# Patient Record
Sex: Male | Born: 2000 | Race: White | Hispanic: No | Marital: Single | Smoking: Never smoker
Health system: Southern US, Community
[De-identification: ages and names within clinical notes are randomized; demographics above are authoritative.]

## PROBLEM LIST (undated history)

## (undated) DIAGNOSIS — J45909 Unspecified asthma, uncomplicated: Secondary | ICD-10-CM

## (undated) HISTORY — PX: WISDOM TOOTH EXTRACTION: SHX21

## (undated) HISTORY — PX: MYRINGOTOMY WITH TUBE PLACEMENT: SHX5663

---
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
## 2020-11-02 ENCOUNTER — Encounter: Payer: Self-pay | Admitting: Nurse Practitioner

## 2020-11-04 ENCOUNTER — Ambulatory Visit: Payer: 59 | Admitting: Gastroenterology

## 2020-11-04 ENCOUNTER — Other Ambulatory Visit: Payer: 59

## 2020-11-04 ENCOUNTER — Encounter: Payer: Self-pay | Admitting: Gastroenterology

## 2020-11-04 ENCOUNTER — Other Ambulatory Visit (INDEPENDENT_AMBULATORY_CARE_PROVIDER_SITE_OTHER): Payer: 59

## 2020-11-04 VITALS — BP 96/68 | HR 76 | Ht 71.0 in | Wt 142.0 lb

## 2020-11-04 DIAGNOSIS — R634 Abnormal weight loss: Secondary | ICD-10-CM

## 2020-11-04 DIAGNOSIS — K529 Noninfective gastroenteritis and colitis, unspecified: Secondary | ICD-10-CM | POA: Diagnosis not present

## 2020-11-04 LAB — CBC WITH DIFFERENTIAL/PLATELET
Basophils Absolute: 0 10*3/uL (ref 0.0–0.1)
Basophils Relative: 0.4 % (ref 0.0–3.0)
Eosinophils Absolute: 0.6 10*3/uL (ref 0.0–0.7)
Eosinophils Relative: 9.3 % — ABNORMAL HIGH (ref 0.0–5.0)
HCT: 44.8 % (ref 39.0–52.0)
Hemoglobin: 15 g/dL (ref 13.0–17.0)
Lymphocytes Relative: 38.8 % (ref 12.0–46.0)
Lymphs Abs: 2.3 10*3/uL (ref 0.7–4.0)
MCHC: 33.4 g/dL (ref 30.0–36.0)
MCV: 89.5 fl (ref 78.0–100.0)
Monocytes Absolute: 0.5 10*3/uL (ref 0.1–1.0)
Monocytes Relative: 9.1 % (ref 3.0–12.0)
Neutro Abs: 2.5 10*3/uL (ref 1.4–7.7)
Neutrophils Relative %: 42.4 % — ABNORMAL LOW (ref 43.0–77.0)
Platelets: 233 10*3/uL (ref 150.0–400.0)
RBC: 5.01 Mil/uL (ref 4.22–5.81)
RDW: 12.3 % (ref 11.5–14.6)
WBC: 6 10*3/uL (ref 4.5–10.5)

## 2020-11-04 LAB — COMPREHENSIVE METABOLIC PANEL
ALT: 11 U/L (ref 0–53)
AST: 13 U/L (ref 0–37)
Albumin: 4.7 g/dL (ref 3.5–5.2)
Alkaline Phosphatase: 63 U/L (ref 39–117)
BUN: 16 mg/dL (ref 6–23)
CO2: 29 mEq/L (ref 19–32)
Calcium: 9.7 mg/dL (ref 8.4–10.5)
Chloride: 104 mEq/L (ref 96–112)
Creatinine, Ser: 0.81 mg/dL (ref 0.40–1.50)
GFR: 127.1 mL/min (ref >60.00)
Glucose, Bld: 94 mg/dL (ref 70–99)
Potassium: 4 mEq/L (ref 3.5–5.1)
Sodium: 139 mEq/L (ref 135–145)
Total Bilirubin: 1.8 mg/dL — ABNORMAL HIGH (ref 0.2–1.2)
Total Protein: 6.9 g/dL (ref 6.0–8.3)

## 2020-11-04 LAB — SEDIMENTATION RATE: Sed Rate: 1 mm/hr (ref 0–15)

## 2020-11-04 NOTE — Patient Instructions (Signed)
Your provider has requested that you go to the basement level for lab work before leaving today. Press "B" on the elevator. The lab is located at the first door on the left as you exit the elevator.   Due to recent changes in healthcare laws, you may see the results of your imaging and laboratory studies on MyChart before your provider has had a chance to review them.  We understand that in some cases there may be results that are confusing or concerning to you. Not all laboratory results come back in the same time frame and the provider may be waiting for multiple results in order to interpret others.  Please give [place] 48 hours in order for your provider to thoroughly review all the results before contacting the office for clarification of your results.    If you are age 64 or older, your body mass index should be between 23-30. Your Body mass index is 19.8 kg/m. If this is out of the aforementioned range listed, please consider follow up with your Primary Care Provider.  If you are age 44 or younger, your body mass index should be between 19-25. Your Body mass index is 19.8 kg/m. If this is out of the aformentioned range listed, please consider follow up with your Primary Care Provider.   __________________________________________________________  The [facility] providers would like to encourage you to use [facility] to communicate with providers for non-urgent requests or questions.  Due to long hold times on the telephone, sending your provider a message by [facility] may be a faster and more efficient way to get a response.  Please allow 48 business hours for a response.  Please remember that this is for non-urgent requests.    I appreciate the  opportunity to care for you  Thank You   Marsa Aris , MD

## 2020-11-04 NOTE — Progress Notes (Signed)
Kenneth Cardenas    [id number]    2000/08/25  Primary Care Physician:Nche, Charlene Brooke, NP  Referring Physician: Flossie Buffy, NP [address] [place],  [place] [address]   Chief complaint: Diarrhea, weight loss  HPI: 20 year old very pleasant male here with complaints of diarrhea since he had COVID infection in July 2022.  Since then he has been having multiple bowel movements per day, on average 3-4 semiformed to liquid bowel movements.  His brother had C. difficile infection in August 2022, he was sharing a bathroom with him at the time.  He has not done any stool testing to exclude C. difficile infection.  No other sick contacts.  No recent antibiotic use. Denies any rectal bleeding, mucus or blood in stool. He has lost about 10 pounds in the past 3 months.  He feels his appetite is back to normal and he is eating well but is unable to gain weight. No family history of celiac disease IBD or GI malignancy.   Outpatient Encounter Medications as of 11/04/2020  Medication Sig   loratadine (CLARITIN) 10 MG tablet Take 10 mg by mouth daily as needed.   No facility-administered encounter medications on file as of 11/04/2020.    Allergies as of 11/04/2020 - Review Complete 11/04/2020  Allergen Reaction Noted   Dog epithelium Itching 01/10/2014   Dog epithelium allergy skin test Itching 01/10/2014   Dust mite extract Other (See Comments) 01/10/2014    Past Medical History:  Diagnosis Date   Asthma     Past Surgical History:  Procedure Laterality Date   MYRINGOTOMY WITH TUBE PLACEMENT     WISDOM TOOTH EXTRACTION      Family History  Problem Relation Age of Onset   Hypertension Mother    Hypertension Father    Colon cancer Neg Hx     Social History   Socioeconomic History   Marital status: Single    Spouse name: Not on file   Number of children: 0   Years of education: Not on file   Highest education level: High school graduate   Occupational History    Comment: Ship broker at [employer]  Tobacco Use   Smoking status: Never   Smokeless tobacco: Never  Vaping Use   Vaping Use: Former  Substance and Sexual Activity   Alcohol use: Not Currently   Drug use: Yes    Types: Marijuana    Comment: marijuana use in past   Sexual activity: Not Currently    Birth control/protection: None  Other Topics Concern   Not on file  Social History Narrative   Student at [employer], virtual classes at this time   Home with parents   Denies any struggles with classes at this time.   Plays video games with friend, limited in person interaction due to [place] pandemic.   Social Determinants of Health   Financial Resource Strain: Not on file  Food Insecurity: Not on file  Transportation Needs: Not on file  Physical Activity: Not on file  Stress: Not on file  Social Connections: Not on file  Intimate Partner Violence: Not on file      Review of systems: All other review of systems negative except as mentioned in the HPI.   Physical Exam: Vitals:   11/04/20 1030  BP: 96/68  Pulse: 76  SpO2: 99%   Body mass index is 19.8 kg/m. Gen:      No acute distress HEENT:  sclera  anicteric Abd:      soft, non-tender; no palpable masses, no distension Ext:    No edema Neuro: alert and oriented x 3 Psych: normal mood and affect  Data Reviewed:  Reviewed labs, radiology imaging, old records and pertinent past GI work up   Assessment and Plan/Recommendations:  20 year old male with complaints of chronic diarrhea, weight loss and abdominal cramping X3 month after COVID infection in July 2022 He had exposure to C. difficile infection  We will check stool GI pathogen panel to exclude acute GI infections including C. Difficile Check TTG IgA antibody to exclude celiac disease Follow-up CBC, CRP/ESR and CMP  If above work-up negative unrevealing for etiology of diarrhea, will consider colonoscopy with biopsies for further  evaluation  Return in 4 to 6 weeks  The patient was provided an opportunity to ask questions and all were answered. The patient agreed with the plan and demonstrated an understanding of the instructions.  Damaris Hippo , MD    CC: Nche, Charlene Brooke, NP

## 2020-11-05 LAB — TISSUE TRANSGLUTAMINASE, IGA: (tTG) Ab, IgA: <1 U/mL

## 2020-11-05 LAB — IGA: Immunoglobulin A: 153 mg/dL (ref 47–310)

## 2020-11-08 ENCOUNTER — Telehealth: Payer: Self-pay | Admitting: Gastroenterology

## 2020-11-08 LAB — GI PROFILE, STOOL, PCR

## 2020-11-08 NOTE — Telephone Encounter (Signed)
Patient returned your call for lab results.  Please call back.  Thank you.

## 2020-11-08 NOTE — Telephone Encounter (Signed)
Inbound call from [facility] calling with results for this pt. Please advise. Thank you.

## 2020-11-08 NOTE — Telephone Encounter (Signed)
Returned patient's call and gave him stool test results

## 2020-11-08 NOTE — Telephone Encounter (Signed)
[facility] called and wanted to alert [place] to the abnormal stool and blood labs for this patient. Please see. Thanks

## 2020-11-08 NOTE — Telephone Encounter (Signed)
Please see results note. Thanks.

## 2021-01-03 ENCOUNTER — Ambulatory Visit: Payer: 59 | Admitting: Gastroenterology

## 2021-07-25 ENCOUNTER — Encounter: Payer: Self-pay | Admitting: Nurse Practitioner

## 2021-07-26 ENCOUNTER — Encounter: Payer: Self-pay | Admitting: Nurse Practitioner

## 2021-07-26 ENCOUNTER — Ambulatory Visit: Payer: 59 | Admitting: Nurse Practitioner

## 2021-07-26 VITALS — BP 122/82 | HR 104 | Temp 97.0°F | Ht 71.0 in | Wt 149.8 lb

## 2021-07-26 DIAGNOSIS — R052 Subacute cough: Secondary | ICD-10-CM

## 2021-10-27 ENCOUNTER — Encounter: Payer: Self-pay | Admitting: Nurse Practitioner

## 2021-10-27 ENCOUNTER — Ambulatory Visit (INDEPENDENT_AMBULATORY_CARE_PROVIDER_SITE_OTHER): Payer: 59 | Admitting: Nurse Practitioner

## 2021-10-27 VITALS — BP 130/80 | HR 107 | Temp 97.7°F | Ht 71.0 in | Wt 169.4 lb

## 2021-10-27 DIAGNOSIS — M940 Chondrocostal junction syndrome [Tietze]: Secondary | ICD-10-CM | POA: Diagnosis not present

## 2021-10-27 DIAGNOSIS — R4589 Other symptoms and signs involving emotional state: Secondary | ICD-10-CM | POA: Insufficient documentation

## 2021-10-27 DIAGNOSIS — F418 Other specified anxiety disorders: Secondary | ICD-10-CM

## 2021-10-27 DIAGNOSIS — Z23 Encounter for immunization: Secondary | ICD-10-CM

## 2021-10-27 DIAGNOSIS — Z0001 Encounter for general adult medical examination with abnormal findings: Secondary | ICD-10-CM | POA: Diagnosis not present

## 2021-10-27 DIAGNOSIS — Q676 Pectus excavatum: Secondary | ICD-10-CM | POA: Insufficient documentation

## 2021-10-27 HISTORY — DX: Chondrocostal junction syndrome (tietze): M94.0

## 2021-10-27 LAB — COMPREHENSIVE METABOLIC PANEL
ALT: 30 U/L (ref 0–53)
AST: 21 U/L (ref 0–37)
Albumin: 4.8 g/dL (ref 3.5–5.2)
Alkaline Phosphatase: 79 U/L (ref 39–117)
BUN: 19 mg/dL (ref 6–23)
CO2: 28 mEq/L (ref 19–32)
Calcium: 9.7 mg/dL (ref 8.4–10.5)
Chloride: 101 mEq/L (ref 96–112)
Creatinine, Ser: 0.76 mg/dL (ref 0.40–1.50)
GFR: 128.68 mL/min (ref >60.00)
Glucose, Bld: 98 mg/dL (ref 70–99)
Potassium: 4 mEq/L (ref 3.5–5.1)
Sodium: 137 mEq/L (ref 135–145)
Total Bilirubin: 1.5 mg/dL — ABNORMAL HIGH (ref 0.2–1.2)
Total Protein: 7.3 g/dL (ref 6.0–8.3)

## 2021-10-27 LAB — CBC
HCT: 45.5 % (ref 39.0–52.0)
Hemoglobin: 15.4 g/dL (ref 13.0–17.0)
MCHC: 33.7 g/dL (ref 30.0–36.0)
MCV: 90.8 fl (ref 78.0–100.0)
Platelets: 245 10*3/uL (ref 150.0–400.0)
RBC: 5.02 Mil/uL (ref 4.22–5.81)
RDW: 12.3 % (ref 11.5–15.5)
WBC: 6.2 10*3/uL (ref 4.0–10.5)

## 2021-10-27 LAB — TSH: TSH: 1.69 u[IU]/mL (ref 0.35–5.50)

## 2021-10-27 NOTE — Progress Notes (Signed)
Complete physical exam  Patient: Kenneth Cardenas   DOB: 01-11-2001   21 y.o. Male  MRN: [id number] Visit Date: 10/27/2021  Subjective:    Chief Complaint  Patient presents with   Annual Exam    CPE Pt fasting  C/o chest discomfort x 3 months off & on  No other concerns   Flu shot given today    Kenneth Cardenas is a 21 y.o. male who presents today for a complete physical exam. He reports consuming a general diet.  Basketball and elliptical 3x/week.  He generally feels fairly well. He reports sleeping fairly well. He does have additional problems to discuss today.  Vision:No Dental:No STD Screen:No  Most recent fall risk assessment:    10/27/2021   10:43 AM  Fall Risk   Falls in the past year? 0  Number falls in past yr: 0  Injury with Fall? 0   Most recent depression screenings:    10/27/2021   11:32 AM 07/26/2021    2:34 PM  PHQ 2/9 Scores  PHQ - 2 Score 0 0  PHQ- 9 Score 0       10/27/2021   11:32 AM 07/26/2021    2:34 PM  GAD 7 : Generalized Anxiety Score  Nervous, Anxious, on Edge 2 2  Control/stop worrying 2 0  Worry too much - different things 0 0  Trouble relaxing 2 2  Restless 0 0  Easily annoyed or irritable 0 0  Afraid - awful might happen 1 2  Total GAD 7 Score 7 6  Anxiety Difficulty Somewhat difficult Somewhat difficult    HPI  Anxiety about health Worries about his health, reports an incident of panic attack while driving: experienced chest tightness which lasted for 28mns. Resolved when he got to his destination. Denied need for psychology referral  Costochondritis, acute Chest pain arching, worse with movement from supine to sitting position and during weight training (lifts 40-60lbs), resolves with rest. No SOB, no diaphoresis, no night sweats. No arm weakness or paresthesia.  Use NSAIDs and alternate between warm and cold compress. Avoid weight lifting till pain resolves.  Past Medical History:  Diagnosis Date   Asthma    Past  Surgical History:  Procedure Laterality Date   MYRINGOTOMY WITH TUBE PLACEMENT     WISDOM TOOTH EXTRACTION     Social History   Socioeconomic History   Marital status: Single    Spouse name: Not on file   Number of children: 0   Years of education: Not on file   Highest education level: High school graduate  Occupational History    Comment: Student at [employer] Tobacco Use   Smoking status: Never   Smokeless tobacco: Never  Vaping Use   Vaping Use: Former  Substance and Sexual Activity   Alcohol use: Not Currently   Drug use: Yes    Types: Marijuana    Comment: marijuana use in past   Sexual activity: Not Currently    Birth control/protection: None  Other Topics Concern   Not on file  Social History Narrative   Student at [employer] virtual classes at this time   Home with parents   Denies any struggles with classes at this time.   Plays video games with friend, limited in person interaction due to [place].   Social Determinants of Health   Financial Resource Strain: Not on file  Food Insecurity: Not on file  Transportation Needs: Not on file  Physical Activity: Not on file  Stress: Not on file  Social Connections: Not on file  Intimate Partner Violence: Not on file   Family Status  Relation Name Status   Mother  Alive   Father  Alive   Neg Hx  (Not Specified)   Family History  Problem Relation Age of Onset   Hypertension Mother    Hypertension Father    Colon cancer Neg Hx    Allergies  Allergen Reactions   Dog Epithelium Itching   Dog Epithelium Allergy Skin Test Itching   Dust Mite Extract Other (See Comments)    Sneezing  Sneezing      Patient Care Team: Gianni Mihalik, Charlene Brooke, NP as PCP - General (Internal Medicine)   Medications: Outpatient Medications Prior to Visit  Medication Sig   loratadine (CLARITIN) 10 MG tablet Take 10 mg by mouth daily as needed. (Patient not taking: Reported on 07/26/2021)   No facility-administered medications prior  to visit.    Review of Systems  Constitutional:  Negative for fever.  HENT:  Negative for congestion and sore throat.   Eyes:        Negative for visual changes  Respiratory:  Negative for cough, chest tightness and shortness of breath.   Cardiovascular:  Positive for chest pain. Negative for palpitations and leg swelling.  Gastrointestinal:  Negative for blood in stool, constipation and diarrhea.  Genitourinary:  Negative for dysuria, frequency and urgency.  Musculoskeletal:  Negative for myalgias.  Skin:  Negative for rash.  Neurological:  Negative for dizziness and headaches.  Hematological:  Does not bruise/bleed easily.  Psychiatric/Behavioral:  Negative for self-injury, sleep disturbance and suicidal ideas. The patient is nervous/anxious.         Objective:  BP 130/80 (BP Location: Right Arm, Patient Position: Sitting, Cuff Size: Small)   Pulse (!) 107   Temp 97.7 F (36.5 C) (Temporal)   Ht '5\' 11"'  (1.803 m)   Wt 169 lb 6.4 oz (76.8 kg)   SpO2 97%   BMI 23.63 kg/m       Physical Exam Vitals and nursing note reviewed.  Constitutional:      General: He is not in acute distress. HENT:     Right Ear: Tympanic membrane, ear canal and external ear normal.     Left Ear: Tympanic membrane, ear canal and external ear normal.     Nose: Nose normal.  Eyes:     General: No scleral icterus.    Extraocular Movements: Extraocular movements intact.     Conjunctiva/sclera: Conjunctivae normal.     Pupils: Pupils are equal, round, and reactive to light.  Neck:     Thyroid: No thyromegaly.  Cardiovascular:     Rate and Rhythm: Normal rate and regular rhythm.     Pulses: Normal pulses.     Heart sounds: Normal heart sounds.  Pulmonary:     Effort: Pulmonary effort is normal.     Breath sounds: Normal breath sounds.  Chest:     Chest wall: Tenderness present.  Abdominal:     General: Bowel sounds are normal. There is no distension.     Tenderness: There is no abdominal  tenderness.  Genitourinary:    Penis: No discharge.   Musculoskeletal:        General: No tenderness. Normal range of motion.     Cervical back: Normal range of motion and neck supple.     Right lower leg: No edema.     Left lower leg: No edema.  Lymphadenopathy:  Cervical: No cervical adenopathy.  Skin:    General: Skin is warm and dry.  Neurological:     Mental Status: He is alert and oriented to person, place, and time.     Cranial Nerves: No cranial nerve deficit.     Deep Tendon Reflexes: Reflexes are normal and symmetric.  Psychiatric:        Mood and Affect: Mood normal.        Behavior: Behavior normal.        Thought Content: Thought content normal.        Judgment: Judgment normal.      No results found for any visits on 10/27/21.    Assessment & Plan:    Routine Health Maintenance and Physical Exam  Immunization History  Administered Date(s) Administered   DTaP 09/26/2000, 11/19/2000, 01/15/2001, 01/13/2002, 07/24/2005   HIB (PRP-T) 09/26/2000, 11/19/2000, 01/15/2001, 01/13/2002   HPV 9-valent 03/29/2016, 07/10/2017   Hepatitis A 01/22/2007, 08/28/2007   Hepatitis A, Ped/Adol-2 Dose 01/22/2007, 08/28/2007   Hepatitis B 05-13-00, 08/14/2000, 04/11/2001   Hepatitis B, PED/ADOLESCENT 09-Mar-2000, 08/14/2000, 04/11/2001   IPV 09/26/2000, 11/19/2000, 09/13/2001, 07/24/2005   Influenza, High Dose Seasonal PF 09/09/2014   Influenza,inj,Quad PF,6+ Mos 01/13/2002, 12/09/2018, 10/27/2021   Influenza-Unspecified 11/17/2015, 10/24/2016   MMR 09/13/2001, 07/24/2005   Meningococcal Conjugate 07/20/2011, 07/10/2017   Pneumococcal Conjugate-13 09/26/2000, 11/19/2000, 09/16/2002   Tdap 07/20/2011   Varicella 09/13/2001, 07/24/2005    Health Maintenance  Topic Date Due   Hepatitis C Screening  Never done   COVID-19 Vaccine (1) 11/12/2021 (Originally 01/11/2001)   HPV VACCINES (3 - Male 3-dose series) 12/30/2021 (Originally 11/09/2017)   TETANUS/TDAP  12/30/2021  (Originally 07/19/2021)   INFLUENZA VACCINE  Completed   HIV Screening  Completed    Discussed health benefits of physical activity, and encouraged him to engage in regular exercise appropriate for his age and condition.  Problem List Items Addressed This Visit       Musculoskeletal and Integument   Costochondritis, acute    Chest pain arching, worse with movement from supine to sitting position and during weight training (lifts 40-60lbs), resolves with rest. No SOB, no diaphoresis, no night sweats. No arm weakness or paresthesia.  Use NSAIDs and alternate between warm and cold compress. Avoid weight lifting till pain resolves.        Other   Anxiety about health    Worries about his health, reports an incident of panic attack while driving: experienced chest tightness which lasted for 58mns. Resolved when he got to his destination. Denied need for psychology referral      Other Visit Diagnoses     Encounter for preventative adult health care exam with abnormal findings    -  Primary   Relevant Orders   CBC   Comprehensive metabolic panel   TSH   Need for immunization against influenza       Relevant Orders   Flu Vaccine QUAD 6+ mos PF IM (Fluarix Quad PF) (Completed)      Return in about 1 year (around 10/28/2022) for CPE (fasting).     CWilfred Lacy NP

## 2021-10-27 NOTE — Assessment & Plan Note (Signed)
Worries about his health, reports an incident of panic attack while driving: experienced chest tightness which lasted for 19mins. Resolved when he got to his destination. Denied need for psychology referral

## 2021-10-27 NOTE — Patient Instructions (Signed)
Use ibuprofen or naproxen for chest wall pain Start daily chest wall stretch exercise Let me know if you change you mind about referral to psyhology. Decrease alcohol consumption Ok to resume daily exercise (stretch before and after exercise) Go to lab  Costochondritis  Costochondritis is irritation and swelling (inflammation) of the tissue that connects the ribs to the breastbone (sternum). This tissue is called cartilage. Costochondritis causes pain in the front of the chest. Usually, the pain: Starts slowly. Is in more than one rib. What are the causes? The exact cause of this condition is not always known. It results from stress on the tissue in the affected area. The cause of this stress could be: Chest injury. Exercise or activity, such as lifting. Very bad coughing. What increases the risk? You are more likely to develop this condition if you: Are male. Are 19-60 years old. Recently started a new exercise or work activity. Have low levels of vitamin D. Have a condition that makes you cough often. What are the signs or symptoms? The main symptom of this condition is chest pain. The pain: Usually starts slowly and can be sharp or dull. Gets worse with deep breathing, coughing, or exercise. Gets better with rest. May be worse when you press on the affected area of your ribs and breastbone. How is this treated? This condition usually goes away on its own over time. Your doctor may prescribe an NSAID, such as ibuprofen. This can help reduce pain and inflammation. Treatment may also include: Resting and avoiding activities that make pain worse. Putting heat or ice on the painful area. Doing exercises to stretch your chest muscles. If these treatments do not help, your doctor may inject a numbing medicine to help relieve the pain. Follow these instructions at home: Managing pain, stiffness, and swelling     If told, put ice on the painful area. To do this: Put ice in a  plastic bag. Place a towel between your skin and the bag. Leave the ice on for 20 minutes, 2-3 times a day. If told, put heat on the affected area. Do this as often as told by your doctor. Use the heat source that your doctor recommends, such as a moist heat pack or a heating pad. Place a towel between your skin and the heat source. Leave the heat on for 20-30 minutes. Take off the heat if your skin turns bright red. This is very important if you cannot feel pain, heat, or cold. You may have a greater risk of getting burned. Activity Rest as told by your doctor. Do not do anything that makes your pain worse. This includes any activities that use chest, belly (abdomen), and side muscles. Do not lift anything that is heavier than 10 lb (4.5 kg), or the limit that you are told, until your doctor says that it is safe. Return to your normal activities as told by your doctor. Ask your doctor what activities are safe for you. General instructions Take over-the-counter and prescription medicines only as told by your doctor. Keep all follow-up visits as told by your doctor. This is important. Contact a doctor if: You have chills or a fever. Your pain does not go away or it gets worse. You have a cough that does not go away. Get help right away if: You are short of breath. You have very bad chest pain that is not helped by medicines, heat, or ice. These symptoms may be an emergency. Do not wait to see if  the symptoms will go away. Get medical help right away. Call your local emergency services (911 in the [place] Do not drive yourself to the hospital. Summary Costochondritis is irritation and swelling (inflammation) of the tissue that connects the ribs to the breastbone (sternum). This condition causes pain in the front of the chest. Treatment may include medicines, rest, heat or ice, and exercises. This information is not intended to replace advice given to you by your health care provider. Make  sure you discuss any questions you have with your health care provider. Document Revised: 04/05/2021 Document Reviewed: 11/29/2018 Elsevier Patient Education  [place].

## 2021-10-27 NOTE — Assessment & Plan Note (Signed)
Chest pain arching, worse with movement from supine to sitting position and during weight training (lifts 40-60lbs), resolves with rest. No SOB, no diaphoresis, no night sweats. No arm weakness or paresthesia.  Use NSAIDs and alternate between warm and cold compress. Avoid weight lifting till pain resolves.

## 2021-11-22 ENCOUNTER — Ambulatory Visit
Admission: EM | Admit: 2021-11-22 | Discharge: 2021-11-22 | Disposition: A | Discharge status: Home / Self Care | Payer: 59

## 2021-11-22 ENCOUNTER — Ambulatory Visit (INDEPENDENT_AMBULATORY_CARE_PROVIDER_SITE_OTHER): Payer: 59

## 2021-11-22 DIAGNOSIS — R059 Cough, unspecified: Secondary | ICD-10-CM | POA: Diagnosis not present

## 2021-11-22 DIAGNOSIS — B9789 Other viral agents as the cause of diseases classified elsewhere: Secondary | ICD-10-CM

## 2021-11-22 DIAGNOSIS — J988 Other specified respiratory disorders: Secondary | ICD-10-CM

## 2021-11-22 DIAGNOSIS — R0602 Shortness of breath: Secondary | ICD-10-CM

## 2021-11-22 DIAGNOSIS — R051 Acute cough: Secondary | ICD-10-CM

## 2021-11-22 DIAGNOSIS — R062 Wheezing: Secondary | ICD-10-CM

## 2021-11-22 DIAGNOSIS — J4530 Mild persistent asthma, uncomplicated: Secondary | ICD-10-CM

## 2021-11-22 DIAGNOSIS — F1290 Cannabis use, unspecified, uncomplicated: Secondary | ICD-10-CM | POA: Diagnosis not present

## 2021-11-22 MED ORDER — ALBUTEROL SULFATE HFA 108 (90 BASE) MCG/ACT IN AERS: 1.0000 | INHALATION_SPRAY | Freq: Four times a day (QID) | RESPIRATORY_TRACT | 0 refills | Status: DC | PRN

## 2021-11-22 MED ORDER — PROMETHAZINE-DM 6.25-15 MG/5ML PO SYRP: 5.0000 mL | ORAL_SOLUTION | Freq: Every evening | ORAL | 0 refills | Status: DC | PRN

## 2021-11-22 MED ORDER — PREDNISONE 50 MG PO TABS: 50.0000 mg | ORAL_TABLET | Freq: Every day | ORAL | 0 refills | Status: DC

## 2021-11-22 NOTE — ED Triage Notes (Signed)
Patient presents to UC for cough and SOB x 1 week. Worse over the weekend. Treating with inhaler.   Denies fever.

## 2021-11-22 NOTE — ED Provider Notes (Signed)
[facility]  Note:  This document was prepared using Systems analyst and may include unintentional dictation errors.  MRN: [id number] DOB: 12-23-2000  Subjective:   Kenneth Cardenas is a 21 y.o. male presenting for 1 week history of acute onset persistent coughing, chest tightness, wheezing and shortness of breath.  He has had some sinus symptoms but they are very mild by comparison to his respiratory symptoms.  Has a history of asthma and has been using his albuterol inhaler.  Patient does not smoke cigarettes.  He has been smoking marijuana daily this past week.  No current facility-administered medications for this encounter.  Current Outpatient Medications:    ALBUTEROL IN, Inhale into the lungs., Disp: , Rfl:    loratadine (CLARITIN) 10 MG tablet, Take 10 mg by mouth daily as needed. (Patient not taking: Reported on 07/26/2021), Disp: , Rfl:    Allergies  Allergen Reactions   Dog Epithelium Itching   Dog Epithelium Allergy Skin Test Itching   Dust Mite Extract Other (See Comments)    Sneezing  Sneezing      Past Medical History:  Diagnosis Date   Asthma      Past Surgical History:  Procedure Laterality Date   MYRINGOTOMY WITH TUBE PLACEMENT     WISDOM TOOTH EXTRACTION      Family History  Problem Relation Age of Onset   Hypertension Mother    Hypertension Father    Colon cancer Neg Hx     Social History   Tobacco Use   Smoking status: Never   Smokeless tobacco: Never  Vaping Use   Vaping Use: Former  Substance Use Topics   Alcohol use: Not Currently   Drug use: Yes    Types: Marijuana    Comment: marijuana use in past    ROS   Objective:   Vitals: BP 135/81 (BP Location: Left Arm)   Pulse 92   Temp 98.4 F (36.9 C) (Oral)   Resp 18   SpO2 95%   Physical Exam Constitutional:      General: He is not in acute distress.    Appearance: Normal appearance. He is well-developed. He is not ill-appearing,  toxic-appearing or diaphoretic.  HENT:     Head: Normocephalic and atraumatic.     Right Ear: External ear normal.     Left Ear: External ear normal.     Nose: Nose normal.     Mouth/Throat:     Mouth: Mucous membranes are moist.  Eyes:     General: No scleral icterus.       Right eye: No discharge.        Left eye: No discharge.     Extraocular Movements: Extraocular movements intact.  Cardiovascular:     Rate and Rhythm: Normal rate and regular rhythm.     Heart sounds: Normal heart sounds. No murmur heard.    No friction rub. No gallop.  Pulmonary:     Effort: Pulmonary effort is normal. No respiratory distress.     Breath sounds: No stridor. Examination of the right-middle field reveals wheezing and rhonchi. Examination of the right-lower field reveals wheezing and rhonchi. Wheezing and rhonchi present. No rales.  Neurological:     Mental Status: He is alert and oriented to person, place, and time.  Psychiatric:        Mood and Affect: Mood normal.        Behavior: Behavior normal.        Thought  Content: Thought content normal.    DG Chest 2 View  Result Date: 11/22/2021 CLINICAL DATA:  Cough, wheezing and shortness of breath for the past week, worse over the past weekend. EXAM: CHEST - 2 VIEW COMPARISON:  01/17/2010 FINDINGS: Normal cardiac silhouette and mediastinal contours. No focal parenchymal opacities. No pleural effusion or pneumothorax. No evidence of edema. No acute osseous abnormalities. IMPRESSION: No acute cardiopulmonary disease. Electronically Signed   By: Simonne Come M.D.   On: 11/22/2021 14:24    Assessment and Plan :   PDMP not reviewed this encounter.  1. Viral respiratory illness   2. Wheezing   3. Mild persistent asthma without complication   4. Acute cough   5. Marijuana use     Recommended smoking cessation.  Given negative chest x-ray, will defer antibiotic use.  In the context of his asthma, respiratory symptoms and pulmonary exam recommended  an oral prednisone course.  Otherwise use supportive care for viral respiratory illness.  Refilled his albuterol inhaler. Counseled patient on potential for adverse effects with medications prescribed/recommended today, ER and return-to-clinic precautions discussed, patient verbalized understanding.    Wallis Bamberg, PA-C 11/22/21 1435

## 2022-04-09 ENCOUNTER — Ambulatory Visit
Admission: EM | Admit: 2022-04-09 | Discharge: 2022-04-09 | Disposition: A | Discharge status: Home / Self Care | Payer: Managed Care, Other (non HMO) | Attending: Nurse Practitioner | Admitting: Nurse Practitioner

## 2022-04-09 DIAGNOSIS — J4521 Mild intermittent asthma with (acute) exacerbation: Secondary | ICD-10-CM | POA: Diagnosis not present

## 2022-04-09 DIAGNOSIS — J208 Acute bronchitis due to other specified organisms: Secondary | ICD-10-CM

## 2022-04-09 MED ORDER — ALBUTEROL SULFATE HFA 108 (90 BASE) MCG/ACT IN AERS: 1.0000 | INHALATION_SPRAY | Freq: Four times a day (QID) | RESPIRATORY_TRACT | 0 refills | Status: DC | PRN

## 2022-04-09 MED ORDER — PREDNISONE 20 MG PO TABS: 40.0000 mg | ORAL_TABLET | Freq: Every day | ORAL | 0 refills | Status: AC

## 2022-04-09 MED ORDER — BENZONATATE 200 MG PO CAPS: 200.0000 mg | ORAL_CAPSULE | Freq: Three times a day (TID) | ORAL | 0 refills | Status: DC | PRN

## 2022-04-09 NOTE — ED Triage Notes (Signed)
Pt presents with c/o chest tightness, wheezing and cough X 1 week.  Pt reports he uses an albuterol that has helped.

## 2022-04-09 NOTE — Discharge Instructions (Signed)
Albuterol inhaler as needed Tessalon as needed for cough Prednisone daily for 5 days Rest and fluids Start over-the-counter Flonase nasal spray daily I would also consider starting an allergy medication daily for at least the next 7 to 14 days Follow-up with your PCP if your symptoms do not improve Please go to the ER for any worsening symptoms

## 2022-04-09 NOTE — ED Provider Notes (Signed)
[facility]    CSN: [id number] Arrival date & time: 04/09/22  1216      History   Chief Complaint Chief Complaint  Patient presents with   Wheezing    HPI Kenneth Cardenas is a 22 y.o. male  presents for evaluation of URI symptoms for 7 days. Patient reports associated symptoms of cough, congestion, chest tightness with wheezing. Denies N/V/D, fevers, ear pain, sore throat, body aches, shortness of breath. Patient does have a hx of asthma.  Does not currently have an inhaler.  Or smoking. No known sick contacts.  Pt has taken musinex OTC for symptoms. Pt has no other concerns at this time.    Wheezing Associated symptoms: cough     Past Medical History:  Diagnosis Date   Asthma     Patient Active Problem List   Diagnosis Date Noted   Anxiety about health 10/27/2021   Costochondritis, acute 10/27/2021   Congenital pectus excavatum 10/27/2021    Past Surgical History:  Procedure Laterality Date   MYRINGOTOMY WITH TUBE PLACEMENT     WISDOM TOOTH EXTRACTION         Home Medications    Prior to Admission medications   Medication Sig Start Date End Date Taking? Authorizing Provider  albuterol (VENTOLIN HFA) 108 (90 Base) MCG/ACT inhaler Inhale 1-2 puffs into the lungs every 6 (six) hours as needed for wheezing or shortness of breath. 04/09/22  Yes Melynda Ripple, NP  benzonatate (TESSALON) 200 MG capsule Take 1 capsule (200 mg total) by mouth 3 (three) times daily as needed for cough. 04/09/22  Yes Melynda Ripple, NP  predniSONE (DELTASONE) 20 MG tablet Take 2 tablets (40 mg total) by mouth daily with breakfast for 5 days. 04/09/22 04/14/22 Yes Melynda Ripple, NP  ALBUTEROL IN Inhale into the lungs.    [provider]  loratadine (CLARITIN) 10 MG tablet Take 10 mg by mouth daily as needed. Patient not taking: Reported on 07/26/2021    [provider]  promethazine-dextromethorphan (PROMETHAZINE-DM) 6.25-15 MG/5ML syrup Take 5 mLs by mouth at bedtime  as needed for cough. 11/22/21   Jaynee Eagles, PA-C    Family History Family History  Problem Relation Age of Onset   Hypertension Mother    Hypertension Father    Colon cancer Neg Hx     Social History Social History   Tobacco Use   Smoking status: Never   Smokeless tobacco: Never  Vaping Use   Vaping Use: Former  Substance Use Topics   Alcohol use: Not Currently   Drug use: Yes    Types: Marijuana    Comment: marijuana use in past     Allergies   Dog epithelium, Dog epithelium (canis lupus familiaris), and Dust mite extract   Review of Systems Review of Systems  HENT:  Positive for congestion.   Respiratory:  Positive for cough and wheezing.      Physical Exam Triage Vital Signs ED Triage Vitals  Enc Vitals Group     BP 04/09/22 1243 118/83     Pulse Rate 04/09/22 1243 72     Resp 04/09/22 1243 18     Temp 04/09/22 1243 98.2 F (36.8 C)     Temp Source 04/09/22 1243 Oral     SpO2 04/09/22 1243 98 %     Weight --      Height --      Head Circumference --      Peak Flow --  Pain Score 04/09/22 1242 4     Pain Loc --      Pain Edu? --      Excl. in [place]? --    No data found.  Updated Vital Signs BP 118/83 (BP Location: Left Arm)   Pulse 72   Temp 98.2 F (36.8 C) (Oral)   Resp 18   SpO2 98%   Visual Acuity Right Eye Distance:   Left Eye Distance:   Bilateral Distance:    Right Eye Near:   Left Eye Near:    Bilateral Near:     Physical Exam Vitals and nursing note reviewed.  Constitutional:      General: He is not in acute distress.    Appearance: Normal appearance. He is not ill-appearing or toxic-appearing.  HENT:     Head: Normocephalic and atraumatic.     Right Ear: Tympanic membrane and ear canal normal.     Left Ear: Tympanic membrane and ear canal normal.     Nose: Congestion present.     Mouth/Throat:     Mouth: Mucous membranes are moist.     Pharynx: No oropharyngeal exudate or posterior oropharyngeal erythema.  Eyes:      Pupils: Pupils are equal, round, and reactive to light.  Cardiovascular:     Rate and Rhythm: Normal rate and regular rhythm.     Heart sounds: Normal heart sounds.  Pulmonary:     Effort: Pulmonary effort is normal.     Breath sounds: Normal breath sounds.  Musculoskeletal:     Cervical back: Normal range of motion and neck supple.  Lymphadenopathy:     Cervical: No cervical adenopathy.  Skin:    General: Skin is warm and dry.  Neurological:     General: No focal deficit present.     Mental Status: He is alert and oriented to person, place, and time.  Psychiatric:        Mood and Affect: Mood normal.        Behavior: Behavior normal.      UC Treatments / Results  Labs (all labs ordered are listed, but only abnormal results are displayed) Labs Reviewed - No data to display  EKG   Radiology No results found.  Procedures Procedures (including critical care time)  Medications Ordered in UC Medications - No data to display  Initial Impression / Assessment and Plan / UC Course  I have reviewed the triage vital signs and the nursing notes.  Pertinent labs & imaging results that were available during my care of the patient were reviewed by me and considered in my medical decision making (see chart for details).     Reviewed exam and symptoms with patient.  No red flags Albuterol inhaler Tessalon as needed Prednisone daily for 5 days Advised Flonase daily as well as considering allergy medication OTC for the next 7 to 14 days PCP follow-up if symptoms do not improve ER precautions reviewed and patient verbalized understanding Final Clinical Impressions(s) / UC Diagnoses   Final diagnoses:  Viral bronchitis  Mild intermittent asthma with acute exacerbation     Discharge Instructions      Albuterol inhaler as needed Tessalon as needed for cough Prednisone daily for 5 days Rest and fluids Start over-the-counter Flonase nasal spray daily I would also  consider starting an allergy medication daily for at least the next 7 to 14 days Follow-up with your PCP if your symptoms do not improve Please go to the ER for any worsening  symptoms    ED Prescriptions     Medication Sig Dispense Auth. Provider   predniSONE (DELTASONE) 20 MG tablet Take 2 tablets (40 mg total) by mouth daily with breakfast for 5 days. 10 tablet Melynda Ripple, NP   albuterol (VENTOLIN HFA) 108 (90 Base) MCG/ACT inhaler Inhale 1-2 puffs into the lungs every 6 (six) hours as needed for wheezing or shortness of breath. 1 each Melynda Ripple, NP   benzonatate (TESSALON) 200 MG capsule Take 1 capsule (200 mg total) by mouth 3 (three) times daily as needed for cough. 20 capsule Melynda Ripple, NP      PDMP not reviewed this encounter.   Melynda Ripple, NP 04/09/22 [phone]

## 2022-04-25 ENCOUNTER — Ambulatory Visit (INDEPENDENT_AMBULATORY_CARE_PROVIDER_SITE_OTHER): Payer: Managed Care, Other (non HMO) | Admitting: Nurse Practitioner

## 2022-04-25 ENCOUNTER — Other Ambulatory Visit (HOSPITAL_BASED_OUTPATIENT_CLINIC_OR_DEPARTMENT_OTHER): Payer: Self-pay

## 2022-04-25 ENCOUNTER — Ambulatory Visit (HOSPITAL_BASED_OUTPATIENT_CLINIC_OR_DEPARTMENT_OTHER)
Admission: RE | Admit: 2022-04-25 | Discharge: 2022-04-25 | Disposition: A | Discharge status: Home / Self Care | Payer: Managed Care, Other (non HMO) | Source: Ambulatory Visit | Attending: Nurse Practitioner | Admitting: Nurse Practitioner

## 2022-04-25 ENCOUNTER — Ambulatory Visit (INDEPENDENT_AMBULATORY_CARE_PROVIDER_SITE_OTHER): Payer: Managed Care, Other (non HMO) | Admitting: Internal Medicine

## 2022-04-25 ENCOUNTER — Encounter: Payer: Self-pay | Admitting: Nurse Practitioner

## 2022-04-25 VITALS — BP 120/84 | HR 86 | Resp 16 | Ht 71.0 in | Wt 163.8 lb

## 2022-04-25 DIAGNOSIS — J4541 Moderate persistent asthma with (acute) exacerbation: Secondary | ICD-10-CM

## 2022-04-25 DIAGNOSIS — R0781 Pleurodynia: Secondary | ICD-10-CM | POA: Insufficient documentation

## 2022-04-25 LAB — PULMONARY FUNCTION TEST
FEF 25-75 Post: 3.19 L/sec
FEF 25-75 Pre: 1.35 L/sec
FEF2575-%Change-Post: 136 %
FEF2575-%Pred-Post: 63 %
FEF2575-%Pred-Pre: 26 %
FEV1-%Change-Post: 44 %
FEV1-%Pred-Post: 69 %
FEV1-%Pred-Pre: 47 %
FEV1-Post: 3.31 L
FEV1-Pre: 2.28 L
FEV1FVC-%Change-Post: 22 %
FEV1FVC-%Pred-Pre: 68 %
FEV6-%Change-Post: 18 %
FEV6-%Pred-Post: 81 %
FEV6-%Pred-Pre: 69 %
FEV6-Post: 4.72 L
FEV6-Pre: 3.97 L
FEV6FVC-%Change-Post: 0 %
FEV6FVC-%Pred-Post: 100 %
FEV6FVC-%Pred-Pre: 99 %
FVC-%Change-Post: 18 %
FVC-%Pred-Post: 81 %
FVC-%Pred-Pre: 69 %
FVC-Post: 4.72 L
Post FEV1/FVC ratio: 70 %
Post FEV6/FVC ratio: 100 %
Pre FEV1/FVC ratio: 57 %
Pre FEV6/FVC Ratio: 99 %

## 2022-04-25 MED ORDER — BENZONATATE 100 MG PO CAPS: 100.0000 mg | ORAL_CAPSULE | Freq: Three times a day (TID) | ORAL | 0 refills | Status: DC | PRN

## 2022-04-25 NOTE — Patient Instructions (Addendum)
Stop all tobacco or marijuana use. Continue use of albuterol 1-2puff every 6-8hrs as needed for cough/wheezing/ SOB. Go to [address] for CXR.  Go to [address] for pulmonary function test today at 3:15pm. 2nd floor

## 2022-04-25 NOTE — Progress Notes (Signed)
Pre/Post Spirometry Performed Today. 

## 2022-04-25 NOTE — Progress Notes (Signed)
Established Patient Visit  Patient: Kenneth Cardenas   DOB: 24-Apr-2000   22 y.o. Male  MRN: [id number] Visit Date: 04/25/2022  Subjective:    Chief Complaint  Patient presents with   Cough    Dry cough with sob    Asthma He complains of chest tightness, cough and shortness of breath. There is no difficulty breathing, frequent throat clearing, hemoptysis, hoarse voice, sputum production or wheezing. This is a recurrent problem. The current episode started more than 1 month ago. The problem occurs constantly. The problem has been waxing and waning. The cough is non-productive and paroxysmal. Associated symptoms include chest pain and dyspnea on exertion. His symptoms are aggravated by exposure to smoke, minimal activity and any activity. His symptoms are alleviated by beta-agonist, oral steroids and prescription cough suppressant. He reports moderate improvement on treatment. His symptoms are not alleviated by OTC cough suppressant. His past medical history is significant for asthma and bronchitis. There is no history of bronchiectasis, COPD, emphysema or pneumonia.   Moderate persistent asthma with acute exacerbation Non productive cough, SOB and chest tightness x 32months, waxing and waning Completed 2courses of oral prednisone with moderate improvement. Minimal improvement with albuterol 2puffs 2-3x/day. Had 2urgent care visits due to current symptoms Admits to smoking marijuana EOD. CXR 10/2021: normal New symptoms in last 1week: left pleuritic pain, no fever of night sweats or weight loss. Peak flow meter today: 120, 212, 231ml/min Repeat CXR  Did not use albuterol use, marijuana use or caffeine or ETOH consumption today. Schedule spirometry test today at 3:15pm. He was provided with address. Continue albuterol and benzonatate as needed after PFT test.  Wt Readings from Last 3 Encounters:  04/25/22 163 lb 12.8 oz (74.3 kg)  10/27/21 169 lb 6.4 oz (76.8 kg)  07/26/21 149  lb 12.8 oz (67.9 kg)    Reviewed medical, surgical, and social history today  Medications: Outpatient Medications Prior to Visit  Medication Sig   albuterol (VENTOLIN HFA) 108 (90 Base) MCG/ACT inhaler Inhale 1-2 puffs into the lungs every 6 (six) hours as needed for wheezing or shortness of breath.   ALBUTEROL IN Inhale into the lungs.   loratadine (CLARITIN) 10 MG tablet Take 10 mg by mouth daily as needed.   Multiple Vitamin (MULTI-VITAMIN) tablet Take 1 tablet by mouth daily.   [DISCONTINUED] benzonatate (TESSALON) 200 MG capsule Take 1 capsule (200 mg total) by mouth 3 (three) times daily as needed for cough. (Patient not taking: Reported on 04/25/2022)   [DISCONTINUED] promethazine-dextromethorphan (PROMETHAZINE-DM) 6.25-15 MG/5ML syrup Take 5 mLs by mouth at bedtime as needed for cough. (Patient not taking: Reported on 04/25/2022)   No facility-administered medications prior to visit.   Reviewed past medical and social history.   ROS per HPI above      Objective:  BP 120/84 (BP Location: Left Arm, Patient Position: Sitting, Cuff Size: Normal)   Pulse 86   Resp 16   Ht 5\' 11"  (1.803 m)   Wt 163 lb 12.8 oz (74.3 kg)   SpO2 100%   PF 230 L/min   BMI 22.85 kg/m       Physical Exam Vitals and nursing note reviewed.  Constitutional:      General: He is not in acute distress. Cardiovascular:     Rate and Rhythm: Normal rate and regular rhythm.     Pulses: Normal pulses.     Heart sounds: Normal heart sounds.  Pulmonary:     Effort: Pulmonary effort is normal.     Breath sounds: Normal breath sounds.  Neurological:     Mental Status: He is alert and oriented to person, place, and time.     No results found for any visits on 04/25/22.    Assessment & Plan:    Problem List Items Addressed This Visit       Respiratory   Moderate persistent asthma with acute exacerbation - Primary    Non productive cough, SOB and chest tightness x 8months, waxing and  waning Completed 2courses of oral prednisone with moderate improvement. Minimal improvement with albuterol 2puffs 2-3x/day. Had 2urgent care visits due to current symptoms Admits to smoking marijuana EOD. CXR 10/2021: normal New symptoms in last 1week: left pleuritic pain, no fever of night sweats or weight loss. Peak flow meter today: 120, 212, 240ml/min Repeat CXR  Did not use albuterol use, marijuana use or caffeine or ETOH consumption today. Schedule spirometry test today at 3:15pm. He was provided with address. Continue albuterol and benzonatate as needed after PFT test.      Relevant Medications   benzonatate (TESSALON) 100 MG capsule   Other Relevant Orders   DG Chest 2 View   Peak flow meter   Spirometry with Graph   Other Visit Diagnoses     Pleuritic pain       Relevant Orders   DG Chest 2 View   Peak flow meter      Return in about 4 weeks (around 05/23/2022) for Asthma.     Wilfred Lacy, NP

## 2022-04-25 NOTE — Assessment & Plan Note (Addendum)
Non productive cough, SOB and chest tightness x 44months, waxing and waning Completed 2courses of oral prednisone with moderate improvement. Minimal improvement with albuterol 2puffs 2-3x/day. Had 2urgent care visits due to current symptoms Admits to smoking marijuana EOD. CXR 10/2021: normal New symptoms in last 1week: left pleuritic pain, no fever of night sweats or weight loss. Peak flow meter today: 120, 212, 215ml/min Repeat CXR  Did not use albuterol use, marijuana use or caffeine or ETOH consumption today. Schedule spirometry test today at 3:15pm. He was provided with address. Continue albuterol and benzonatate as needed after PFT test.

## 2022-04-25 NOTE — Patient Instructions (Signed)
Pre/Post Spirometry Performed Today. 

## 2022-04-26 ENCOUNTER — Other Ambulatory Visit: Payer: Self-pay | Admitting: Nurse Practitioner

## 2022-04-26 DIAGNOSIS — J4541 Moderate persistent asthma with (acute) exacerbation: Secondary | ICD-10-CM

## 2022-04-26 MED ORDER — FLUTICASONE-SALMETEROL 45-21 MCG/ACT IN AERO: 2.0000 | INHALATION_SPRAY | Freq: Two times a day (BID) | RESPIRATORY_TRACT | 2 refills | Status: DC

## 2022-04-26 NOTE — Progress Notes (Signed)
See A/P and procedure note

## 2022-04-26 NOTE — Assessment & Plan Note (Signed)
PFT indicates severe obstructive lung disease with significant response to bronchodilator. Sent advair BID Continue SABA prn F/up in 44month

## 2022-05-17 ENCOUNTER — Ambulatory Visit (INDEPENDENT_AMBULATORY_CARE_PROVIDER_SITE_OTHER): Payer: Managed Care, Other (non HMO) | Admitting: Pulmonary Disease

## 2022-05-17 ENCOUNTER — Encounter: Payer: Self-pay | Admitting: Pulmonary Disease

## 2022-05-17 VITALS — BP 124/76 | HR 80 | Ht 71.0 in | Wt 163.0 lb

## 2022-05-17 DIAGNOSIS — J4540 Moderate persistent asthma, uncomplicated: Secondary | ICD-10-CM

## 2022-05-17 MED ORDER — FLUTICASONE-SALMETEROL 230-21 MCG/ACT IN AERO: 2.0000 | INHALATION_SPRAY | Freq: Two times a day (BID) | RESPIRATORY_TRACT | 12 refills | Status: DC

## 2022-05-17 NOTE — Progress Notes (Signed)
Synopsis: Referred in April 2024 for asthma  Subjective:   PATIENT ID: Kenneth Cardenas GENDER: male DOB: 2000-07-09, MRN: [id number]  HPI  Chief Complaint  Patient presents with   Consult    Self referral for asthma. States he was diagnosed as having asthma as a child but it has has come back recently. Increased wheezing and chest tightness.    Lois Ostrom is a 22 year old male who is referred to pulmonary clinic for asthma.   He reports increased cough, wheezing, chest tightness and shortness of breath over the past couple of months.  His allergies have been affecting him more so this year with increased sinus congestion, pressure and ear fullness.  He denies any significant post nasal drainage.  The cough can sometimes keep him up at night.  He was recently started on Advair 45-21 mcg 2 puffs twice daily with no significant improvement in his symptoms.  He has history of childhood allergy in which she required Flovent inhaler during preschool years.  When he entered elementary school he was only using albuterol as needed and this was infrequently.  He was previously smoking marijuana which he has quit a month ago.  He was vaping nicotine in high school which she quit 4 years ago.  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  Spirometry 04/25/2022 showed FEV1/FVC ratio 57, FEV1 2.28L (47%) with a postbronchodilator FEV1/FVC ratio of 70%, FEV1 3.31L (69%).  Past Medical History:  Diagnosis Date   Asthma    Costochondritis, acute 10/27/2021     Family History  Problem Relation Age of Onset   Hypertension Mother    Hypertension Father    Colon cancer Neg Hx      Social History   Socioeconomic History   Marital status: Single    Spouse name: Not on file   Number of children: 0   Years of education: Not on file   Highest education level: High school graduate  Occupational History    Comment: Consulting civil engineer at [employer]  Tobacco Use   Smoking status: Never    Smokeless tobacco: Never  Vaping Use   Vaping Use: Former  Substance and Sexual Activity   Alcohol use: Not Currently   Drug use: Yes    Types: Marijuana    Comment: marijuana use in past   Sexual activity: Not Currently    Birth control/protection: None  Other Topics Concern   Not on file  Social History Narrative   Student at [employer], virtual classes at this time   Home with parents   Denies any struggles with classes at this time.   Plays video games with friend, limited in person interaction due to COVID pandemic.   Social Determinants of Health   Financial Resource Strain: Not on file  Food Insecurity: Not on file  Transportation Needs: Not on file  Physical Activity: Not on file  Stress: Not on file  Social Connections: Not on file  Intimate Partner Violence: Not on file     Allergies  Allergen Reactions   Dog Epithelium Itching   Dog Epithelium (Canis Lupus Familiaris) Itching   Dust Mite Extract Other (See Comments)    Sneezing  Sneezing       Outpatient Medications Prior to Visit  Medication Sig Dispense Refill   albuterol (VENTOLIN HFA) 108 (90 Base) MCG/ACT inhaler Inhale 1-2 puffs into the lungs every 6 (six) hours as needed for wheezing or shortness of breath. 1 each 0  benzonatate (TESSALON) 100 MG capsule Take 1 capsule (100 mg total) by mouth 3 (three) times daily as needed. 21 capsule 0   loratadine (CLARITIN) 10 MG tablet Take 10 mg by mouth daily as needed.     Multiple Vitamin (MULTI-VITAMIN) tablet Take 1 tablet by mouth daily.     fluticasone-salmeterol (ADVAIR HFA) 45-21 MCG/ACT inhaler Inhale 2 puffs into the lungs 2 (two) times daily. Rinse mouth after each use 60 each 2   ALBUTEROL IN Inhale into the lungs.     No facility-administered medications prior to visit.    Review of Systems  Constitutional:  Negative for chills, fever, malaise/fatigue and weight loss.  HENT:  Negative for congestion, sinus pain and sore throat.   Eyes: Negative.    Respiratory:  Positive for cough, shortness of breath and wheezing. Negative for hemoptysis and sputum production.   Cardiovascular:  Negative for chest pain, palpitations, orthopnea, claudication and leg swelling.  Gastrointestinal:  Negative for abdominal pain, heartburn, nausea and vomiting.  Genitourinary: Negative.   Musculoskeletal:  Negative for joint pain and myalgias.  Skin:  Negative for rash.  Neurological:  Negative for weakness.  Endo/Heme/Allergies: Negative.   Psychiatric/Behavioral: Negative.     Objective:   Vitals:   05/17/22 1026  BP: 124/76  Pulse: 80  SpO2: 98%  Weight: 163 lb (73.9 kg)  Height:  (1.803 m)     Physical Exam Constitutional:      General: He is not in acute distress. HENT:     Head: Normocephalic and atraumatic.  Eyes:     Extraocular Movements: Extraocular movements intact.     Conjunctiva/sclera: Conjunctivae normal.     Pupils: Pupils are equal, round, and reactive to light.  Cardiovascular:     Rate and Rhythm: Normal rate and regular rhythm.     Pulses: Normal pulses.     Heart sounds: Normal heart sounds. No murmur heard. Pulmonary:     Effort: Pulmonary effort is normal.     Breath sounds: Normal breath sounds.  Abdominal:     General: Bowel sounds are normal.     Palpations: Abdomen is soft.  Musculoskeletal:     Right lower leg: No edema.     Left lower leg: No edema.  Lymphadenopathy:     Cervical: No cervical adenopathy.  Skin:    General: Skin is warm and dry.  Neurological:     General: No focal deficit present.     Mental Status: He is alert.  Psychiatric:        Mood and Affect: Mood normal.        Behavior: Behavior normal.        Thought Content: Thought content normal.        Judgment: Judgment normal.    CBC    Component Value Date/Time   WBC 6.2 10/27/2021 1140   RBC 5.02 10/27/2021 1140   HGB 15.4 10/27/2021 1140   HCT 45.5 10/27/2021 1140   PLT 245.0 10/27/2021 1140   MCV 90.8  10/27/2021 1140   MCHC 33.7 10/27/2021 1140   RDW 12.3 10/27/2021 1140   LYMPHSABS 2.3 11/04/2020 1114   MONOABS 0.5 11/04/2020 1114   EOSABS 0.6 11/04/2020 1114   BASOSABS 0.0 11/04/2020 1114      Latest Ref Rng & Units 10/27/2021   11:40 AM 11/04/2020   11:14 AM 12/09/2018   12:16 PM  BMP  Glucose 70 - 99 mg/dL 98  94  696   BUN 6 -  23 mg/dL 19  16  14    Creatinine 0.40 - 1.50 mg/dL 4.01  0.27  2.53   Sodium 135 - 145 mEq/L 137  139  139   Potassium 3.5 - 5.1 mEq/L 4.0  4.0  4.0   Chloride 96 - 112 mEq/L 101  104  103   CO2 19 - 32 mEq/L 28  29  29    Calcium 8.4 - 10.5 mg/dL 9.7  9.7  66.4    Chest imaging: CXR 04/25/22 The heart size and mediastinal contours are within normal limits. Both lungs are clear. The visualized skeletal structures are unremarkable.  PFT:    Latest Ref Rng & Units 04/25/2022    4:18 PM  PFT Results  FVC-Predicted Pre % 69   FVC-Post L 4.72   FVC-Predicted Post % 81   Pre FEV1/FVC % % 57   Post FEV1/FCV % % 70   FEV1-Pre L 2.28   FEV1-Predicted Pre % 47   FEV1-Post L 3.31     Labs:  Path:  Echo:  Heart Catheterization:  Assessment & Plan:   Moderate persistent asthma without complication - Plan: fluticasone-salmeterol (ADVAIR HFA) 230-21 MCG/ACT inhaler  Discussion: Callin Ashe is a 22 year old male who is referred to pulmonary clinic for asthma.   Patient has moderate persistent asthma without issue at this time.  He has significant bronchodilator response on recent spirometry testing.  Will increase his Advair HFA inhaler to 230-21 mcg 2 puffs twice daily.  He can use as needed albuterol.  Recommend using fluticasone nasal spray and Allegra daily for seasonal allergy symptoms.  If he continues to have significant allergy symptoms that are affecting his asthma we will start him on montelukast 10 mg daily.  Follow-up in 3 months.  Melody Comas, MD [facility] Office: [phone]   Current  Outpatient Medications:    albuterol (VENTOLIN HFA) 108 (90 Base) MCG/ACT inhaler, Inhale 1-2 puffs into the lungs every 6 (six) hours as needed for wheezing or shortness of breath., Disp: 1 each, Rfl: 0   benzonatate (TESSALON) 100 MG capsule, Take 1 capsule (100 mg total) by mouth 3 (three) times daily as needed., Disp: 21 capsule, Rfl: 0   fluticasone-salmeterol (ADVAIR HFA) 230-21 MCG/ACT inhaler, Inhale 2 puffs into the lungs 2 (two) times daily., Disp: 1 each, Rfl: 12   loratadine (CLARITIN) 10 MG tablet, Take 10 mg by mouth daily as needed., Disp: , Rfl:    Multiple Vitamin (MULTI-VITAMIN) tablet, Take 1 tablet by mouth daily., Disp: , Rfl:

## 2022-05-17 NOTE — Patient Instructions (Addendum)
Start advair 230-43mcg 2 puffs twice daily - rinse mouth out after each use  Use albuterol inhaler 1-2 puffs every 4-6 hours as needed  Recommend using flonase, 1 spray per nostril daily  Start Allegra  daily for allergies  Let us know if you would like to start singulair  daily for allergies if inhaler therapy alone not helping with symptoms  Follow up in 3 months, call sooner if needed

## 2022-06-29 ENCOUNTER — Telehealth: Payer: Self-pay | Admitting: Pulmonary Disease

## 2022-06-29 DIAGNOSIS — J4521 Mild intermittent asthma with (acute) exacerbation: Secondary | ICD-10-CM

## 2022-06-29 NOTE — Telephone Encounter (Signed)
Dr. Francine Graven are you okay me refilling albuterol inhaler? Looks like pcp had been prescribing

## 2022-06-29 NOTE — Telephone Encounter (Signed)
PT states he is out of Albuterol inhaler. Can Dr. Francine Graven submit Cyril Mourning or should he speak to his PCP who originally RX's it for him?  Pharm [facility]

## 2022-06-30 MED ORDER — ALBUTEROL SULFATE HFA 108 (90 BASE) MCG/ACT IN AERS: 1.0000 | INHALATION_SPRAY | Freq: Four times a day (QID) | RESPIRATORY_TRACT | 11 refills | Status: DC | PRN

## 2022-06-30 NOTE — Telephone Encounter (Signed)
Refill sent to his pharmacy.  JD

## 2022-07-03 NOTE — Telephone Encounter (Signed)
ATC X 1 LVM. Please advise albuterol inhaler has been sent to pharmacy

## 2022-07-04 NOTE — Telephone Encounter (Signed)
d 

## 2022-08-25 ENCOUNTER — Ambulatory Visit: Payer: BC Managed Care – PPO | Admitting: Pulmonary Disease

## 2022-08-25 ENCOUNTER — Encounter: Payer: Self-pay | Admitting: Pulmonary Disease

## 2022-08-25 VITALS — BP 116/70 | HR 75 | Ht 71.0 in | Wt 163.0 lb

## 2022-08-25 DIAGNOSIS — J4540 Moderate persistent asthma, uncomplicated: Secondary | ICD-10-CM | POA: Diagnosis not present

## 2022-08-25 NOTE — Patient Instructions (Addendum)
Continue advair 230-54mcg 2 puffs twice daily - rinse mouth out after each use - recommend leaving it by your toothbrush as a reminder to use it twice daily  Use albuterol inhaler 1-2 puffs every 4-6 hours  Consider Breo ellipta 200-59mcg 1 puff daily if covered by your insurance plan  Follow up in 1 year, call sooner if needed

## 2022-08-25 NOTE — Progress Notes (Signed)
Synopsis: Referred in April 2024 for asthma  Subjective:   PATIENT ID: Kenneth Cardenas GENDER: male DOB: 28-Nov-2000, MRN: [id number]  HPI  Chief Complaint  Patient presents with   Follow-up    3 mo f/u for asthma. States he has not been using his Advair as prescribed, once a week. Breathing has improved since last visit.    Kenneth Cardenas is a 22 year old male who returns to pulmonary clinic for asthma.   OV 05/17/22 He reports increased cough, wheezing, chest tightness and shortness of breath over the past couple of months.  His allergies have been affecting him more so this year with increased sinus congestion, pressure and ear fullness.  He denies any significant post nasal drainage.  The cough can sometimes keep him up at night.  He was recently started on Advair 45-21 mcg 2 puffs twice daily with no significant improvement in his symptoms.  He has history of childhood allergy in which she required Flovent inhaler during preschool years.  When he entered elementary school he was only using albuterol as needed and this was infrequently.  He was previously smoking marijuana which he has quit a month ago.  He was vaping nicotine in high school which he quit 4 years ago.  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  Spirometry 04/25/2022 showed FEV1/FVC ratio 57, FEV1 2.28L (47%) with a postbronchodilator FEV1/FVC ratio of 70%, FEV1 3.31L (69%).  Today 08/25/22  He has been doing well since last visit. He stopped using his inhaler about 1 month ago as he was feeling great. He reports having increased chest tightness and shortness of breath over recent days. No urgent care, ER or need for prednisone since last visit.    Past Medical History:  Diagnosis Date   Asthma    Costochondritis, acute 10/27/2021     Family History  Problem Relation Age of Onset   Hypertension Mother    Hypertension Father    Colon cancer Neg Hx      Social History   Socioeconomic  History   Marital status: Single    Spouse name: Not on file   Number of children: 0   Years of education: Not on file   Highest education level: High school graduate  Occupational History    Comment: Consulting civil engineer at [employer]  Tobacco Use   Smoking status: Never   Smokeless tobacco: Never  Vaping Use   Vaping status: Former  Substance and Sexual Activity   Alcohol use: Not Currently   Drug use: Yes    Types: Marijuana    Comment: marijuana use in past   Sexual activity: Not Currently    Birth control/protection: None  Other Topics Concern   Not on file  Social History Narrative   Student at [employer], virtual classes at this time   Home with parents   Denies any struggles with classes at this time.   Plays video games with friend, limited in person interaction due to COVID pandemic.   Social Determinants of Health   Financial Resource Strain: Not on file  Food Insecurity: Not on file  Transportation Needs: Not on file  Physical Activity: Not on file  Stress: Not on file  Social Connections: Not on file  Intimate Partner Violence: Not on file     Allergies  Allergen Reactions   Dog Epithelium Itching   Dog Epithelium (Canis Lupus Familiaris) Itching   Dust Mite Extract Other (See Comments)    Sneezing  Sneezing       Outpatient Medications Prior to Visit  Medication Sig Dispense Refill   albuterol (VENTOLIN HFA) 108 (90 Base) MCG/ACT inhaler Inhale 1-2 puffs into the lungs every 6 (six) hours as needed for wheezing or shortness of breath. 1 each 11   benzonatate (TESSALON) 100 MG capsule Take 1 capsule (100 mg total) by mouth 3 (three) times daily as needed. 21 capsule 0   fluticasone-salmeterol (ADVAIR HFA) 230-21 MCG/ACT inhaler Inhale 2 puffs into the lungs 2 (two) times daily. 1 each 12   Multiple Vitamin (MULTI-VITAMIN) tablet Take 1 tablet by mouth daily.     loratadine (CLARITIN) 10 MG tablet Take 10 mg by mouth daily as needed.     No facility-administered  medications prior to visit.    Review of Systems  Constitutional:  Negative for chills, fever, malaise/fatigue and weight loss.  HENT:  Negative for congestion, sinus pain and sore throat.   Eyes: Negative.   Respiratory:  Positive for shortness of breath. Negative for cough, hemoptysis, sputum production and wheezing.   Cardiovascular:  Negative for chest pain, palpitations, orthopnea, claudication and leg swelling.  Gastrointestinal:  Negative for abdominal pain, heartburn, nausea and vomiting.  Genitourinary: Negative.   Musculoskeletal:  Negative for joint pain and myalgias.  Skin:  Negative for rash.  Neurological:  Negative for weakness.  Endo/Heme/Allergies: Negative.   Psychiatric/Behavioral: Negative.     Objective:   Vitals:   08/25/22 0954  BP: 116/70  Pulse: 75  SpO2: 98%  Weight: 163 lb (73.9 kg)  Height: 5\' 11"  (1.803 m)   Physical Exam Constitutional:      General: He is not in acute distress. HENT:     Head: Normocephalic and atraumatic.  Eyes:     Conjunctiva/sclera: Conjunctivae normal.  Cardiovascular:     Rate and Rhythm: Normal rate and regular rhythm.     Pulses: Normal pulses.     Heart sounds: Normal heart sounds. No murmur heard. Pulmonary:     Effort: Pulmonary effort is normal.     Breath sounds: Normal breath sounds.  Musculoskeletal:     Right lower leg: No edema.     Left lower leg: No edema.  Skin:    General: Skin is warm and dry.  Neurological:     General: No focal deficit present.     Mental Status: He is alert.    CBC    Component Value Date/Time   WBC 6.2 10/27/2021 1140   RBC 5.02 10/27/2021 1140   HGB 15.4 10/27/2021 1140   HCT 45.5 10/27/2021 1140   PLT 245.0 10/27/2021 1140   MCV 90.8 10/27/2021 1140   MCHC 33.7 10/27/2021 1140   RDW 12.3 10/27/2021 1140   LYMPHSABS 2.3 11/04/2020 1114   MONOABS 0.5 11/04/2020 1114   EOSABS 0.6 11/04/2020 1114   BASOSABS 0.0 11/04/2020 1114      Latest Ref Rng & Units  10/27/2021   11:40 AM 11/04/2020   11:14 AM 12/09/2018   12:16 PM  BMP  Glucose 70 - 99 mg/dL 98  94  191   BUN 6 - 23 mg/dL 19  16  14    Creatinine 0.40 - 1.50 mg/dL 4.78  2.95  6.21   Sodium 135 - 145 mEq/L 137  139  139   Potassium 3.5 - 5.1 mEq/L 4.0  4.0  4.0   Chloride 96 - 112 mEq/L 101  104  103   CO2 19 - 32 mEq/L 28  29  29   Calcium 8.4 - 10.5 mg/dL 9.7  9.7  57.8    Chest imaging: CXR 04/25/22 The heart size and mediastinal contours are within normal limits. Both lungs are clear. The visualized skeletal structures are unremarkable.  PFT:    Latest Ref Rng & Units 04/25/2022    4:18 PM  PFT Results  FVC-Predicted Pre % 69   FVC-Post L 4.72   FVC-Predicted Post % 81   Pre FEV1/FVC % % 57   Post FEV1/FCV % % 70   FEV1-Pre L 2.28   FEV1-Predicted Pre % 47   FEV1-Post L 3.31     Labs:  Path:  Echo:  Heart Catheterization:  Assessment & Plan:   Moderate persistent asthma without complication  Discussion: Kenneth Cardenas is a 22 year old male who is referred to pulmonary clinic for asthma.   Patient has moderate persistent asthma without issue at this time.  He has significant bronchodilator response on recent spirometry testing.  Continue Advair HFA inhaler to 230-21 mcg 2 puffs twice daily.  He can use as needed albuterol.  Consider changing to breo ellipta 200-54mcg 1 puff daily as this may be easier regimen for compliance.   Follow-up in 1 year  Melody Comas, MD [facility] Office: [phone]   Current Outpatient Medications:    albuterol (VENTOLIN HFA) 108 (90 Base) MCG/ACT inhaler, Inhale 1-2 puffs into the lungs every 6 (six) hours as needed for wheezing or shortness of breath., Disp: 1 each, Rfl: 11   benzonatate (TESSALON) 100 MG capsule, Take 1 capsule (100 mg total) by mouth 3 (three) times daily as needed., Disp: 21 capsule, Rfl: 0   fluticasone-salmeterol (ADVAIR HFA) 230-21 MCG/ACT inhaler, Inhale 2 puffs into  the lungs 2 (two) times daily., Disp: 1 each, Rfl: 12   Multiple Vitamin (MULTI-VITAMIN) tablet, Take 1 tablet by mouth daily., Disp: , Rfl:

## 2022-11-02 ENCOUNTER — Encounter: Payer: BC Managed Care – PPO | Admitting: Nurse Practitioner

## 2022-11-10 ENCOUNTER — Encounter: Payer: Self-pay | Admitting: Nurse Practitioner

## 2022-11-10 ENCOUNTER — Ambulatory Visit (INDEPENDENT_AMBULATORY_CARE_PROVIDER_SITE_OTHER): Payer: Managed Care, Other (non HMO) | Admitting: Nurse Practitioner

## 2022-11-10 VITALS — BP 114/70 | HR 77 | Temp 98.0°F | Ht 70.5 in | Wt 168.0 lb

## 2022-11-10 DIAGNOSIS — Z0000 Encounter for general adult medical examination without abnormal findings: Secondary | ICD-10-CM

## 2022-11-10 DIAGNOSIS — Z23 Encounter for immunization: Secondary | ICD-10-CM | POA: Diagnosis not present

## 2022-11-10 LAB — COMPREHENSIVE METABOLIC PANEL
ALT: 29 U/L (ref 0–53)
AST: 15 U/L (ref 0–37)
Albumin: 4.9 g/dL (ref 3.5–5.2)
Alkaline Phosphatase: 87 U/L (ref 39–117)
BUN: 15 mg/dL (ref 6–23)
CO2: 29 meq/L (ref 19–32)
Calcium: 10 mg/dL (ref 8.4–10.5)
Chloride: 99 meq/L (ref 96–112)
Creatinine, Ser: 0.7 mg/dL (ref 0.40–1.50)
GFR: 130.96 mL/min (ref >60.00)
Glucose, Bld: 98 mg/dL (ref 70–99)
Potassium: 3.8 meq/L (ref 3.5–5.1)
Sodium: 138 meq/L (ref 135–145)
Total Bilirubin: 1.1 mg/dL (ref 0.2–1.2)
Total Protein: 6.6 g/dL (ref 6.0–8.3)

## 2022-11-10 NOTE — Progress Notes (Signed)
Complete physical exam  Patient: Kenneth Cardenas   DOB: 2001-01-29   22 y.o. Male  MRN: [id number] Visit Date: 11/10/2022  Subjective:    Chief Complaint  Patient presents with   Annual Exam    Look behind ear at indention from glasses   Kenneth Cardenas is a 21 y.o. male who presents today for a complete physical exam. He reports consuming a general diet.  No exercise regimen  He generally feels well. He reports sleeping well. He does not have additional problems to discuss today.  Vision:Yes Dental:No STD Screen:No PSA:No  BP Readings from Last 3 Encounters:  11/10/22 114/70  08/25/22 116/70  05/17/22 124/76   Wt Readings from Last 3 Encounters:  11/10/22 168 lb (76.2 kg)  08/25/22 163 lb (73.9 kg)  05/17/22 163 lb (73.9 kg)   Most recent fall risk assessment:    11/10/2022    8:20 AM  Fall Risk   Falls in the past year? 0  Number falls in past yr: 0  Injury with Fall? 0  Risk for fall due to : No Fall Risks  Follow up Falls prevention discussed   Depression screen:Yes - No Depression Most recent depression screenings:    11/10/2022    8:20 AM 04/25/2022    2:17 PM  PHQ 2/9 Scores  PHQ - 2 Score 0 1  PHQ- 9 Score 0 5   HPI  Moderate persistent asthma with acute exacerbation Improved cough and chest tightness with increased advair dose BID and albuterol BID. Under the care of polmonology   Past Medical History:  Diagnosis Date   Asthma    Costochondritis, acute 10/27/2021   Past Surgical History:  Procedure Laterality Date   MYRINGOTOMY WITH TUBE PLACEMENT     WISDOM TOOTH EXTRACTION     Social History   Socioeconomic History   Marital status: Single    Spouse name: Not on file   Number of children: 0   Years of education: Not on file   Highest education level: High school graduate  Occupational History    Comment: Consulting civil engineer at [employer]  Tobacco Use   Smoking status: Never   Smokeless tobacco: Never  Vaping Use   Vaping status: Former   Substance and Sexual Activity   Alcohol use: Not Currently   Drug use: Yes    Types: Marijuana    Comment: marijuana use in past   Sexual activity: Not Currently    Birth control/protection: None  Other Topics Concern   Not on file  Social History Narrative   Student at [employer], virtual classes at this time   Home with parents   Denies any struggles with classes at this time.   Plays video games with friend, limited in person interaction due to COVID pandemic.   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   fluticasone-salmeterol (ADVAIR HFA) 230-21 MCG/ACT inhaler Inhale 2 puffs into the lungs 2 (two) times daily.   Multiple Vitamin (MULTI-VITAMIN) tablet Take 1 tablet by mouth daily.   [DISCONTINUED] benzonatate (TESSALON) 100 MG capsule Take 1 capsule (100 mg total) by mouth 3 (three) times daily as needed. (Patient not taking: Reported on 11/10/2022)   No facility-administered medications prior to visit.    Review of Systems   Constitutional:  Negative for activity change, appetite change and unexpected weight change.  Respiratory: Negative.    Cardiovascular: Negative.   Gastrointestinal: Negative.   Endocrine: Negative for cold intolerance and heat intolerance.  Genitourinary: Negative.   Musculoskeletal: Negative.   Skin: Negative.   Neurological: Negative.   Hematological: Negative.   Psychiatric/Behavioral:  Negative for behavioral problems, decreased concentration, dysphoric mood, hallucinations, self-injury, sleep disturbance and suicidal ideas. The patient is not nervous/anxious.        Objective:  BP 114/70   Pulse 77   Temp 98 F (36.7 C) (Temporal)   Ht 5' 10.5" (1.791 m)   Wt 168 lb (76.2 kg)   SpO2 98%   BMI 23.76 kg/m     Physical Exam Vitals and nursing note reviewed.  Constitutional:      General: He is not in acute distress. HENT:     Right Ear: Tympanic membrane, ear canal and external ear normal. No mastoid tenderness.     Left Ear: Tympanic membrane, ear canal and external ear normal. No mastoid tenderness.     Nose: Nose normal.  Eyes:     Extraocular Movements: Extraocular movements intact.     Conjunctiva/sclera: Conjunctivae normal.     Pupils: Pupils are equal, round, and reactive to light.  Neck:     Thyroid: No thyroid mass, thyromegaly or thyroid tenderness.  Cardiovascular:     Rate and Rhythm: Normal rate and regular rhythm.     Pulses: Normal pulses.     Heart sounds: Normal heart sounds.  Pulmonary:     Effort: Pulmonary effort is normal.     Breath sounds: Normal breath sounds.  Abdominal:     General: Bowel sounds are normal.     Palpations: Abdomen is soft.  Musculoskeletal:        General: Normal range of motion.     Cervical back: Normal range of motion and neck supple. No rigidity or tenderness.     Right lower leg: No edema.     Left lower leg: No edema.  Lymphadenopathy:     Cervical: No cervical adenopathy.  Skin:    General: Skin is warm  and dry.  Neurological:     Mental Status: He is alert and oriented to person, place, and time.     Cranial Nerves: No cranial nerve deficit.  Psychiatric:        Mood and Affect: Mood normal.        Behavior: Behavior normal.        Thought Content: Thought content normal.     No results found for any visits on 11/10/22.  Assessment & Plan:    Routine Health Maintenance and Physical Exam  Immunization History  Administered Date(s) Administered   DTaP 09/26/2000, 11/19/2000, 01/15/2001, 01/13/2002, 07/24/2005   HIB (PRP-T) 09/26/2000, 11/19/2000, 01/15/2001, 01/13/2002   HPV 9-valent 03/29/2016, 07/10/2017   Hepatitis A 01/22/2007, 08/28/2007   Hepatitis A, Ped/Adol-2 Dose 01/22/2007, 08/28/2007   Hepatitis B 03/31/2000, 08/14/2000, 04/11/2001   Hepatitis B, PED/ADOLESCENT July 28, 2000, 08/14/2000, 04/11/2001   IPV 09/26/2000, 11/19/2000, 09/13/2001, 07/24/2005   Influenza, High Dose Seasonal PF 09/09/2014   Influenza,inj,Quad PF,6+ Mos 01/13/2002, 12/09/2018, 10/27/2021   Influenza,inj,Quad PF,6-35 Mos 01/13/2002   Influenza-Unspecified 11/17/2015, 10/24/2016   MMR 09/13/2001, 07/24/2005   Meningococcal Conjugate 07/20/2011, 07/10/2017   Pneumococcal Conjugate PCV 7 09/26/2000, 11/19/2000, 09/16/2002   Pneumococcal Conjugate-13 09/26/2000, 11/19/2000, 09/16/2002   Tdap 07/20/2011, 11/10/2022   Varicella 09/13/2001, 07/24/2005   Health Maintenance  Topic Date Due   INFLUENZA VACCINE  04/30/2023 (Originally 08/31/2022)   COVID-19 Vaccine (1 - 2023-24 season) 05/11/2023 (Originally 10/01/2022)   HPV VACCINES (3 - Male 3-dose series) 11/10/2023 (Originally 10/02/2017)   Hepatitis C Screening  11/10/2023 (Originally 07/13/2018)   DTaP/Tdap/Td (8 - Td or Tdap) 11/09/2032   HIV Screening  Completed   Discussed health benefits of physical activity, and encouraged him to engage in regular exercise appropriate for his age and condition.  Problem List Items Addressed This Visit    None Visit Diagnoses     Preventative health care    -  Primary   Relevant Orders   Tdap vaccine greater than or equal to 7yo IM (Completed)   Comprehensive metabolic panel   Immunization due       Relevant Orders   Tdap vaccine greater than or equal to 7yo IM (Completed)      Return in about 1 year (around 11/10/2023) for CPE (fasting).     Alysia Penna, NP

## 2022-11-10 NOTE — Progress Notes (Signed)
Stable Follow instructions as discussed during office visit.

## 2022-11-10 NOTE — Patient Instructions (Signed)
Go to lab Maintain Heart healthy diet and daily exercise. Maintain current medications.  Preventive Care 43-22 Years Old, Male Preventive care refers to lifestyle choices and visits with your health care provider that can promote health and wellness. Preventive care visits are also called wellness exams. What can I expect for my preventive care visit? Counseling During your preventive care visit, your health care provider may ask about your: Medical history, including: Past medical problems. Family medical history. Current health, including: Emotional well-being. Home life and relationship well-being. Sexual activity. Lifestyle, including: Alcohol, nicotine or tobacco, and drug use. Access to firearms. Diet, exercise, and sleep habits. Safety issues such as seatbelt and bike helmet use. Sunscreen use. Work and work Astronomer. Physical exam Your health care provider may check your: Height and weight. These may be used to calculate your BMI (body mass index). BMI is a measurement that tells if you are at a healthy weight. Waist circumference. This measures the distance around your waistline. This measurement also tells if you are at a healthy weight and may help predict your risk of certain diseases, such as type 2 diabetes and high blood pressure. Heart rate and blood pressure. Body temperature. Skin for abnormal spots. What immunizations do I need?  Vaccines are usually given at various ages, according to a schedule. Your health care provider will recommend vaccines for you based on your age, medical history, and lifestyle or other factors, such as travel or where you work. What tests do I need? Screening Your health care provider may recommend screening tests for certain conditions. This may include: Lipid and cholesterol levels. Diabetes screening. This is done by checking your blood sugar (glucose) after you have not eaten for a while (fasting). Hepatitis B test. Hepatitis  C test. HIV (human immunodeficiency virus) test. STI (sexually transmitted infection) testing, if you are at risk. Talk with your health care provider about your test results, treatment options, and if necessary, the need for more tests. Follow these instructions at home: Eating and drinking  Eat a healthy diet that includes fresh fruits and vegetables, whole grains, lean protein, and low-fat dairy products. Drink enough fluid to keep your urine pale yellow. Take vitamin and mineral supplements as recommended by your health care provider. Do not drink alcohol if your health care provider tells you not to drink. If you drink alcohol: Limit how much you have to 0-2 drinks a day. Know how much alcohol is in your drink. In the [place] one drink equals one 12 oz bottle of beer (355 mL), one 5 oz glass of wine (148 mL), or one 1 oz glass of hard liquor (44 mL). Lifestyle Brush your teeth every morning and night with fluoride toothpaste. Floss one time each day. Exercise for at least 30 minutes 5 or more days each week. Do not use any products that contain nicotine or tobacco. These products include cigarettes, chewing tobacco, and vaping devices, such as e-cigarettes. If you need help quitting, ask your health care provider. Do not use drugs. If you are sexually active, practice safe sex. Use a condom or other form of protection to prevent STIs. Find healthy ways to manage stress, such as: Meditation, yoga, or listening to music. Journaling. Talking to a trusted person. Spending time with friends and family. Minimize exposure to UV radiation to reduce your risk of skin cancer. Safety Always wear your seat belt while driving or riding in a vehicle. Do not drive: If you have been drinking alcohol. Do not ride  with someone who has been drinking. If you have been using any mind-altering substances or drugs. While texting. When you are tired or distracted. Wear a helmet and other protective  equipment during sports activities. If you have firearms in your house, make sure you follow all gun safety procedures. Seek help if you have been physically or sexually abused. What's next? Go to your health care provider once a year for an annual wellness visit. Ask your health care provider how often you should have your eyes and teeth checked. Stay up to date on all vaccines. This information is not intended to replace advice given to you by your health care provider. Make sure you discuss any questions you have with your health care provider. Document Revised: 07/14/2020 Document Reviewed: 07/14/2020 Elsevier Patient Education  2024 [employer].

## 2022-11-10 NOTE — Assessment & Plan Note (Signed)
Improved cough and chest tightness with increased advair dose BID and albuterol BID. Under the care of polmonology

## 2023-05-19 ENCOUNTER — Other Ambulatory Visit: Payer: Self-pay | Admitting: Pulmonary Disease

## 2023-05-19 DIAGNOSIS — J4540 Moderate persistent asthma, uncomplicated: Secondary | ICD-10-CM

## 2023-06-26 ENCOUNTER — Other Ambulatory Visit: Payer: Self-pay | Admitting: Pulmonary Disease

## 2023-06-26 DIAGNOSIS — J4540 Moderate persistent asthma, uncomplicated: Secondary | ICD-10-CM

## 2023-08-06 ENCOUNTER — Other Ambulatory Visit: Payer: Self-pay | Admitting: Pulmonary Disease

## 2023-08-06 DIAGNOSIS — J4540 Moderate persistent asthma, uncomplicated: Secondary | ICD-10-CM

## 2023-08-10 ENCOUNTER — Other Ambulatory Visit: Payer: Self-pay | Admitting: Pulmonary Disease

## 2023-08-10 DIAGNOSIS — J4521 Mild intermittent asthma with (acute) exacerbation: Secondary | ICD-10-CM

## 2023-09-03 ENCOUNTER — Ambulatory Visit: Admitting: Emergency Medicine

## 2023-09-04 ENCOUNTER — Encounter: Payer: Self-pay | Admitting: Nurse Practitioner

## 2023-09-04 ENCOUNTER — Ambulatory Visit: Admitting: Nurse Practitioner

## 2023-09-04 VITALS — BP 118/76 | HR 80 | Temp 98.0°F | Ht 71.0 in | Wt 169.0 lb

## 2023-09-04 DIAGNOSIS — R1013 Epigastric pain: Secondary | ICD-10-CM | POA: Diagnosis not present

## 2023-09-04 MED ORDER — FAMOTIDINE 20 MG PO TABS: 20.0000 mg | ORAL_TABLET | Freq: Two times a day (BID) | ORAL | 0 refills | Status: DC

## 2023-09-04 MED ORDER — ONDANSETRON HCL 4 MG PO TABS: 4.0000 mg | ORAL_TABLET | Freq: Three times a day (TID) | ORAL | 0 refills | Status: AC | PRN

## 2023-09-04 NOTE — Patient Instructions (Addendum)
 Avoid skipping meals, caffeinated drinks, and late night eating. Schedule lab appointment to complete H pylori test in 2weeks. Use zofran  as needed for nausea Start famotidine  20mg  BID x 2weeks Call office if no improvement in 2weeks

## 2023-09-04 NOTE — Progress Notes (Signed)
 Established Patient Visit  Patient: Kenneth Cardenas   DOB: [birth year]   23 y.o. Male  MRN: [id number] Visit Date: 09/04/2023  Subjective:    Chief Complaint  Patient presents with   Nausea    Nausea/vomiting for 2 weeks fatigue    GI Problem The primary symptoms include fatigue, nausea and vomiting. Primary symptoms do not include fever, weight loss, abdominal pain, diarrhea, melena, hematemesis, jaundice, hematochezia, dysuria, myalgias, arthralgias or rash. The illness began more than 7 days ago. The onset was gradual. The problem has not changed since onset. Nausea began more than 1 week ago. The nausea is associated with eating. Exacerbated by: skipping meals.  The emesis contains stomach contents.  The illness does not include chills, anorexia, dysphagia, odynophagia, bloating, constipation, tenesmus, back pain or itching. Associated medical issues do not include GERD, gallstones, liver disease, alcohol abuse, PUD, gastric bypass, bowel resection, hemorrhoids or diverticulitis.   Wt Readings from Last 3 Encounters:  09/04/23 169 lb (76.7 kg)  11/10/22 168 lb (76.2 kg)  08/25/22 163 lb (73.9 kg)   Reviewed medical, surgical, and social history today  Medications: Outpatient Medications Prior to Visit  Medication Sig   ADVAIR HFA 230-21 MCG/ACT inhaler INHALE 2 PUFFS BY MOUTH 2 TIMES A DAY   albuterol  (VENTOLIN  HFA) 108 (90 Base) MCG/ACT inhaler INHALE 1-2 PUFFS BY MOUTH EVERY 6 HOURS AS NEEDED FOR WHEEZING OR FOR SHORTNESS OF BREATH   Multiple Vitamin (MULTI-VITAMIN) tablet Take 1 tablet by mouth daily.   No facility-administered medications prior to visit.   Reviewed past medical and social history.   ROS per HPI above      Objective:  BP 118/76 (BP Location: Left Arm, Patient Position: Sitting, Cuff Size: Normal)   Pulse 80   Temp 98 F (36.7 C) (Oral)   Ht 5' 11 (1.803 m)   Wt 169 lb (76.7 kg)   SpO2 99%   BMI 23.57 kg/m      Physical  Exam Vitals and nursing note reviewed.  Constitutional:      General: He is not in acute distress.    Appearance: He is not ill-appearing.  Cardiovascular:     Rate and Rhythm: Normal rate.     Pulses: Normal pulses.  Pulmonary:     Effort: Pulmonary effort is normal.  Abdominal:     General: Bowel sounds are normal. There is no distension.     Palpations: Abdomen is soft.     Tenderness: There is no abdominal tenderness. There is no left CVA tenderness or guarding.  Neurological:     Mental Status: He is alert and oriented to person, place, and time.  Psychiatric:        Mood and Affect: Mood normal.        Behavior: Behavior normal.        Thought Content: Thought content normal.     No results found for any visits on 09/04/23.    Assessment & Plan:    Problem List Items Addressed This Visit   None Visit Diagnoses       Dyspepsia    -  Primary   Relevant Medications   ondansetron  (ZOFRAN ) 4 MG tablet   famotidine  (PEPCID ) 20 MG tablet   Other Relevant Orders   H. pylori breath test     Unable to complete H pylori test today due to nausea. Schedule lab appointment to complete H pylori  test in 2weeks. Avoid skipping meals, caffeinated drinks, and late night eating. Use zofran  as needed for nausea Start famotidine  20mg  BID x 2weeks Call office if no improvement in 2weeks Consider CMP and ABDOMEN US  if no improvement and negative H pylori?  Return if symptoms worsen or fail to improve.     Roselie Mood, NP

## 2023-09-11 ENCOUNTER — Other Ambulatory Visit: Payer: Self-pay | Admitting: Pulmonary Disease

## 2023-09-11 DIAGNOSIS — J4540 Moderate persistent asthma, uncomplicated: Secondary | ICD-10-CM

## 2023-09-18 ENCOUNTER — Other Ambulatory Visit (INDEPENDENT_AMBULATORY_CARE_PROVIDER_SITE_OTHER)

## 2023-09-18 DIAGNOSIS — R1013 Epigastric pain: Secondary | ICD-10-CM | POA: Diagnosis not present

## 2023-09-24 ENCOUNTER — Ambulatory Visit: Payer: Self-pay | Admitting: Nurse Practitioner

## 2023-09-24 DIAGNOSIS — R1013 Epigastric pain: Secondary | ICD-10-CM

## 2023-09-24 LAB — H. PYLORI BREATH TEST: H. pylori Breath Test: NOT DETECTED

## 2023-09-27 NOTE — Addendum Note (Signed)
 Addended by: KATHEEN GARDEN L on: 09/27/2023 01:07 PM   Modules accepted: Orders

## 2023-10-03 ENCOUNTER — Ambulatory Visit
Admission: RE | Admit: 2023-10-03 | Discharge: 2023-10-03 | Disposition: A | Discharge status: Home / Self Care | Source: Ambulatory Visit | Attending: Nurse Practitioner | Admitting: Nurse Practitioner

## 2023-10-03 ENCOUNTER — Ambulatory Visit: Payer: Self-pay | Admitting: Nurse Practitioner

## 2023-10-03 DIAGNOSIS — R1013 Epigastric pain: Secondary | ICD-10-CM | POA: Insufficient documentation

## 2023-10-03 MED ORDER — PANTOPRAZOLE SODIUM 20 MG PO TBEC: DELAYED_RELEASE_TABLET | ORAL | 0 refills | Status: AC

## 2023-10-13 ENCOUNTER — Other Ambulatory Visit: Payer: Self-pay | Admitting: Pulmonary Disease

## 2023-10-13 DIAGNOSIS — J4540 Moderate persistent asthma, uncomplicated: Secondary | ICD-10-CM

## 2023-11-27 ENCOUNTER — Ambulatory Visit
Admission: RE | Admit: 2023-11-27 | Discharge: 2023-11-27 | Disposition: A | Discharge status: Home / Self Care | Attending: Physician Assistant | Admitting: Physician Assistant

## 2023-11-27 ENCOUNTER — Other Ambulatory Visit: Payer: Self-pay

## 2023-11-27 VITALS — BP 132/84 | HR 91 | Temp 98.8°F | Resp 17 | Ht 71.0 in | Wt 160.0 lb

## 2023-11-27 DIAGNOSIS — J029 Acute pharyngitis, unspecified: Secondary | ICD-10-CM

## 2023-11-27 DIAGNOSIS — J4541 Moderate persistent asthma with (acute) exacerbation: Secondary | ICD-10-CM | POA: Diagnosis not present

## 2023-11-27 MED ORDER — PREDNISONE 20 MG PO TABS: 40.0000 mg | ORAL_TABLET | Freq: Every day | ORAL | 0 refills | Status: AC

## 2023-11-27 MED ORDER — LIDOCAINE VISCOUS HCL 2 % MT SOLN: 15.0000 mL | OROMUCOSAL | 0 refills | Status: AC | PRN

## 2023-11-27 NOTE — ED Triage Notes (Addendum)
 Pt presents with complaints of nasal congestion x 5 days. Nasal congestion has been accompanied with wheezing for two days. Hx of asthma. Prescribed inhalers + Ibuprofen at home with minimal improvement/relief. No fevers. Denies sick contacts. Has a cough in triage. Sore throat today. Rates pain as a 2/10. Feeling sinus pressure in face.

## 2023-11-27 NOTE — ED Provider Notes (Signed)
 [facility]    CSN: [id number] Arrival date & time: 11/27/23  1354      History   Chief Complaint Chief Complaint  Patient presents with   Nasal Congestion    Wheezing and coughing for 4 days - Entered by patient   Wheezing    HPI Kenneth Cardenas is a 23 y.o. male.  has a past medical history of Asthma and Costochondritis, acute (10/27/2021).   HPI  Discussed the use of AI scribe software for clinical note transcription with the patient, who gave verbal consent to proceed.  The patient, with asthma, presents with a sore throat and sinus congestion.  Symptoms began on Friday with a sore throat, progressing into significant sinus congestion and drainage. He experiences severe sinus headaches and a substantial amount of mucus production when coughing. No fever, chills, ear pain, nausea, vomiting, diarrhea, or rashes. He reports body aches and significant congestion. No recent travel or known exposure to similar symptoms.  Two days ago, he began experiencing asthma symptoms, including chest weakness, wheezing, and frequent coughing. The asthma symptoms were severe enough to cause difficulty breathing at times, particularly yesterday. He has been using Advair twice daily, which provides relief for a significant duration, but albuterol  has not been as effective.  For symptom management, he has been taking ibuprofen, Mucinex, and Zyrtec. His inhaler, Advair, provides relief, but albuterol  does not offer much benefit.   Past Medical History:  Diagnosis Date   Asthma    Costochondritis, acute 10/27/2021    Patient Active Problem List   Diagnosis Date Noted   Dyspepsia 10/03/2023   Moderate persistent asthma with acute exacerbation 04/25/2022   Anxiety about health 10/27/2021   Congenital pectus excavatum 10/27/2021    Past Surgical History:  Procedure Laterality Date   MYRINGOTOMY WITH TUBE PLACEMENT     WISDOM TOOTH EXTRACTION         Home Medications     Prior to Admission medications   Medication Sig Start Date End Date Taking? Authorizing Provider  lidocaine (XYLOCAINE) 2 % solution Use as directed 15 mLs in the mouth or throat as needed. 11/27/23  Yes Anahis Furgeson E, PA-C  predniSONE  (DELTASONE ) 20 MG tablet Take 2 tablets (40 mg total) by mouth daily for 5 days. 11/27/23 12/02/23 Yes Freddi Forster E, PA-C  ADVAIR HFA 230-21 MCG/ACT inhaler INHALE 2 PUFFS BY MOUTH 2 TIMES A DAY 09/11/23   Kara Dorn NOVAK, MD  albuterol  (VENTOLIN  HFA) 108 (90 Base) MCG/ACT inhaler INHALE 1-2 PUFFS BY MOUTH EVERY 6 HOURS AS NEEDED FOR WHEEZING OR FOR SHORTNESS OF BREATH 08/10/23   Kara Dorn NOVAK, MD  Multiple Vitamin (MULTI-VITAMIN) tablet Take 1 tablet by mouth daily. 07/10/17   [provider]  ondansetron  (ZOFRAN ) 4 MG tablet Take 1 tablet (4 mg total) by mouth every 8 (eight) hours as needed for nausea or vomiting. 09/04/23   Nche, Roselie Rockford, NP  pantoprazole  (PROTONIX ) 20 MG tablet 20mg  BID x 2weeks, then 20mg  daily before breakfast till complete 10/03/23   Nche, Roselie Rockford, NP    Family History Family History  Problem Relation Age of Onset   Hypertension Mother    Hypertension Father    Colon cancer Neg Hx     Social History Social History   Tobacco Use   Smoking status: Never   Smokeless tobacco: Never  Vaping Use   Vaping status: Former  Substance Use Topics   Alcohol use: Not Currently   Drug use: Yes  Types: Marijuana    Comment: marijuana use in past     Allergies   Dog epithelium, Dog epithelium (canis lupus familiaris), and Dust mite extract   Review of Systems Review of Systems  Constitutional:  Negative for chills and fever.  HENT:  Positive for congestion, postnasal drip, sinus pressure and sore throat. Negative for ear pain.   Respiratory:  Positive for cough, chest tightness and wheezing.   Gastrointestinal:  Negative for diarrhea, nausea and vomiting.  Musculoskeletal:  Positive for myalgias.  Skin:   Negative for rash.  Neurological:  Positive for headaches.     Physical Exam Triage Vital Signs ED Triage Vitals  Encounter Vitals Group     BP 11/27/23 1418 132/84     Girls Systolic BP Percentile --      Girls Diastolic BP Percentile --      Boys Systolic BP Percentile --      Boys Diastolic BP Percentile --      Pulse Rate 11/27/23 1418 91     Resp 11/27/23 1418 17     Temp 11/27/23 1418 98.8 F (37.1 C)     Temp Source 11/27/23 1418 Oral     SpO2 11/27/23 1418 97 %     Weight 11/27/23 1417 160 lb (72.6 kg)     Height 11/27/23 1417 5' 11 (1.803 m)     Head Circumference --      Peak Flow --      Pain Score 11/27/23 1417 2     Pain Loc --      Pain Education --      Exclude from Growth Chart --    No data found.  Updated Vital Signs BP 132/84 (BP Location: Right Arm)   Pulse 91   Temp 98.8 F (37.1 C) (Oral)   Resp 17   Ht 5' 11 (1.803 m)   Wt 160 lb (72.6 kg)   SpO2 97%   BMI 22.32 kg/m   Visual Acuity Right Eye Distance:   Left Eye Distance:   Bilateral Distance:    Right Eye Near:   Left Eye Near:    Bilateral Near:     Physical Exam Vitals reviewed.  Constitutional:      General: He is awake.     Appearance: Normal appearance. He is well-developed and well-groomed.  HENT:     Head: Normocephalic and atraumatic.     Right Ear: Hearing, tympanic membrane and ear canal normal.     Left Ear: Hearing, tympanic membrane and ear canal normal.     Mouth/Throat:     Lips: Pink.     Mouth: Mucous membranes are moist.     Pharynx: Oropharynx is clear. Uvula midline. Posterior oropharyngeal erythema and postnasal drip present. No pharyngeal swelling, oropharyngeal exudate or uvula swelling.     Tonsils: No tonsillar exudate or tonsillar abscesses. 0 on the right. 0 on the left.  Eyes:     General: Lids are normal. Gaze aligned appropriately.     Extraocular Movements: Extraocular movements intact.  Cardiovascular:     Rate and Rhythm: Normal rate and  regular rhythm.     Heart sounds: Normal heart sounds.  Pulmonary:     Effort: Pulmonary effort is normal.     Breath sounds: Normal breath sounds. No decreased air movement. No decreased breath sounds, wheezing, rhonchi or rales.  Musculoskeletal:     Cervical back: Normal range of motion and neck supple.  Lymphadenopathy:  Head:     Right side of head: No submental, submandibular or preauricular adenopathy.     Left side of head: No submental, submandibular or preauricular adenopathy.     Cervical:     Right cervical: No superficial cervical adenopathy.    Left cervical: No superficial cervical adenopathy.     Upper Body:     Right upper body: No supraclavicular adenopathy.     Left upper body: No supraclavicular adenopathy.  Skin:    General: Skin is warm and dry.  Neurological:     General: No focal deficit present.     Mental Status: He is alert and oriented to person, place, and time.  Psychiatric:        Mood and Affect: Mood normal.        Behavior: Behavior normal. Behavior is cooperative.        Thought Content: Thought content normal.        Judgment: Judgment normal.      UC Treatments / Results  Labs (all labs ordered are listed, but only abnormal results are displayed) Labs Reviewed - No data to display  EKG   Radiology No results found.  Procedures Procedures (including critical care time)  Medications Ordered in UC Medications - No data to display  Initial Impression / Assessment and Plan / UC Course  I have reviewed the triage vital signs and the nursing notes.  Pertinent labs & imaging results that were available during my care of the patient were reviewed by me and considered in my medical decision making (see chart for details).      Final Clinical Impressions(s) / UC Diagnoses   Final diagnoses:  Sore throat  Moderate persistent asthma with acute exacerbation   Asthma exacerbation Experiencing an asthma exacerbation with chest  tightness, wheezing, and increased coughing, likely triggered by a concurrent upper respiratory infection. Symptoms began two days ago, causing difficulty breathing. Advair provides relief, but albuterol  is less effective. Lungs are clear, and heart sounds are normal. - Prescribe a short course of oral steroids to reduce inflammation and open airways. - Continue Advair twice daily. - Use albuterol  as needed for acute relief. - Recommend Robitussin and Mucinex for symptomatic relief of congestion. - Continue antihistamines for allergy symptoms. - Add Flonase for additional allergy symptom control.  Upper respiratory infection Presents with symptoms of an upper respiratory infection, including sore throat, sinus congestion, drainage, and sinus headaches. Symptoms began on Friday with significant sinus drainage and congestion. No fever, ear pain, or white patches observed. - Prescribe viscous lidocaine for sore throat relief. - Recommend continued use of antihistamines and Flonase to manage symptoms.    Discharge Instructions      VISIT SUMMARY:  You came in today because of a sore throat and sinus congestion that started on Friday. Your symptoms have progressed to include severe sinus headaches, significant mucus production, and asthma symptoms such as chest tightness, wheezing, and frequent coughing. You have been using Advair and albuterol  for asthma, and taking ibuprofen, Mucinex, and Zyrtec for symptom relief.  YOUR PLAN:  -ASTHMA EXACERBATION: An asthma exacerbation means that your asthma symptoms have worsened, likely due to a concurrent upper respiratory infection. You are experiencing chest tightness, wheezing, and increased coughing. We will prescribe a short course of oral steroids to reduce inflammation and open your airways. Continue using Advair twice daily and use albuterol  as needed for acute relief. For congestion, you can use Robitussin and Mucinex, and continue taking  antihistamines  for allergy symptoms. We also recommend adding Flonase to help control your allergy symptoms.  -UPPER RESPIRATORY INFECTION: An upper respiratory infection is a viral infection that affects your nose, throat, and airways. You have symptoms like a sore throat, sinus congestion, drainage, and sinus headaches. We will prescribe viscous lidocaine to help relieve your sore throat. Continue using antihistamines and  add Flonase to manage your symptoms.  INSTRUCTIONS:  Please follow up if your symptoms do not improve or if they worsen. Continue taking your medications as prescribed and use your inhalers as directed. If you experience any new or concerning symptoms, contact our office immediately or go to the ED     ED Prescriptions     Medication Sig Dispense Auth. Provider   predniSONE  (DELTASONE ) 20 MG tablet Take 2 tablets (40 mg total) by mouth daily for 5 days. 10 tablet Larah Kuntzman E, PA-C   lidocaine (XYLOCAINE) 2 % solution Use as directed 15 mLs in the mouth or throat as needed. 100 mL Antario Yasuda E, PA-C      PDMP not reviewed this encounter.   Marylene Rocky BRAVO, PA-C 11/27/23 1519

## 2023-11-27 NOTE — Discharge Instructions (Addendum)
 VISIT SUMMARY:  You came in today because of a sore throat and sinus congestion that started on Friday. Your symptoms have progressed to include severe sinus headaches, significant mucus production, and asthma symptoms such as chest tightness, wheezing, and frequent coughing. You have been using Advair and albuterol  for asthma, and taking ibuprofen, Mucinex, and Zyrtec for symptom relief.  YOUR PLAN:  -ASTHMA EXACERBATION: An asthma exacerbation means that your asthma symptoms have worsened, likely due to a concurrent upper respiratory infection. You are experiencing chest tightness, wheezing, and increased coughing. We will prescribe a short course of oral steroids to reduce inflammation and open your airways. Continue using Advair twice daily and use albuterol  as needed for acute relief. For congestion, you can use Robitussin and Mucinex, and continue taking antihistamines for allergy symptoms. We also recommend adding Flonase to help control your allergy symptoms.  -UPPER RESPIRATORY INFECTION: An upper respiratory infection is a viral infection that affects your nose, throat, and airways. You have symptoms like a sore throat, sinus congestion, drainage, and sinus headaches. We will prescribe viscous lidocaine to help relieve your sore throat. Continue using antihistamines and  add Flonase to manage your symptoms.  INSTRUCTIONS:  Please follow up if your symptoms do not improve or if they worsen. Continue taking your medications as prescribed and use your inhalers as directed. If you experience any new or concerning symptoms, contact our office immediately or go to the ED

## 2024-01-04 ENCOUNTER — Ambulatory Visit: Admitting: Pulmonary Disease

## 2024-01-10 ENCOUNTER — Ambulatory Visit: Admitting: Pulmonary Disease

## 2024-01-10 ENCOUNTER — Encounter: Payer: Self-pay | Admitting: Pulmonary Disease

## 2024-01-10 VITALS — BP 116/68 | HR 84 | Ht 71.0 in | Wt 172.0 lb

## 2024-01-10 DIAGNOSIS — J4540 Moderate persistent asthma, uncomplicated: Secondary | ICD-10-CM

## 2024-01-10 MED ORDER — BREZTRI AEROSPHERE 160-9-4.8 MCG/ACT IN AERO: INHALATION_SPRAY | RESPIRATORY_TRACT | Status: AC

## 2024-01-10 NOTE — Patient Instructions (Addendum)
 Try breztri inhaler 2 puffs twice daily - rinse mouth out after each use - recommend leaving it by your toothbrush as a reminder to use it twice daily  If you like this inhaler better than the advair, please let us  know and we will send in prescription  Use albuterol  inhaler 1-2 puffs every 4-6 hours  Follow up in 3 months, call sooner if needed

## 2024-01-10 NOTE — Progress Notes (Signed)
 Established Patient Pulmonology Office Visit   Subjective:  Patient ID: Kenneth Cardenas, male    DOB: 01-May-2000  MRN: [id number]  CC:  Chief Complaint  Patient presents with   Medical Management of Chronic Issues    Pt states wheezing am after waking up     Discussed the use of AI scribe software for clinical note transcription with the patient, who gave verbal consent to proceed.  History of Present Illness Kenneth Cardenas is a 23 year old male with asthma who presents with persistent wheezing and shortness of breath during physical activity.  He has exertional wheezing and shortness of breath with jogging or basketball, developing within about 5 minutes of running or after a few possessions, requiring rest despite prescribed Advair.  He often forgets his morning Advair and is not consistently taking it twice daily. When he used Advair regularly, he did not need albuterol . With current inconsistent use, he uses albuterol  1 to 2 times daily.  He vaped in high school and until last week smoked Delta-9, which he stopped and replaced with gummies.  In the past week, with more regular Advair use, he has not had morning wheezing. He denies current smoking or vaping since stopping Delta-9 last week.        ROS   Current Medications[1]      Objective:  BP 116/68   Pulse 84   Ht 5' 11 (1.803 m) Comment: per pt  Wt 172 lb (78 kg)   SpO2 98%   BMI 23.99 kg/m     Physical Exam Constitutional:      General: He is not in acute distress.    Appearance: Normal appearance.  Eyes:     General: No scleral icterus.    Conjunctiva/sclera: Conjunctivae normal.  Cardiovascular:     Rate and Rhythm: Normal rate and regular rhythm.  Pulmonary:     Breath sounds: No wheezing, rhonchi or rales.  Musculoskeletal:     Right lower leg: No edema.     Left lower leg: No edema.  Skin:    General: Skin is warm and dry.  Neurological:     General: No focal deficit present.       Diagnostic Review:       Assessment & Plan:   Assessment & Plan Moderate persistent asthma without complication      Assessment and Plan Assessment & Plan Moderate persistent asthma Exacerbated by physical activity. Inconsistent Advair use. Symptoms persist despite treatment. Recent smoking cessation may improve symptoms. Considered therapy escalation. - Provided Breztri samples for one-month trial. - Instructed Breztri two puffs twice daily, replacing Advair. - Advised to rinse mouth after Breztri use. - Will prescribe Breztri if effective. - Scheduled follow-up in three months. - Discussed injectable medication if Breztri insufficient, with lab tests for agent and dosing.      Return in about 3 months (around 04/09/2024) for f/u visit [name].   Dorn KATHEE Kara, MD     [1]  Current Outpatient Medications:    ADVAIR HFA 230-21 MCG/ACT inhaler, INHALE 2 PUFFS BY MOUTH 2 TIMES A DAY, Disp: 12 g, Rfl: 0   albuterol  (VENTOLIN  HFA) 108 (90 Base) MCG/ACT inhaler, INHALE 1-2 PUFFS BY MOUTH EVERY 6 HOURS AS NEEDED FOR WHEEZING OR FOR SHORTNESS OF BREATH, Disp: 25.5 g, Rfl: 0   budesonide-glycopyrrolate-formoterol (BREZTRI AEROSPHERE) 160-9-4.8 MCG/ACT AERO inhaler, 2 samples, Disp: , Rfl:    lidocaine  (XYLOCAINE ) 2 % solution, Use as directed 15 mLs in the mouth  or throat as needed., Disp: 100 mL, Rfl: 0   Multiple Vitamin (MULTI-VITAMIN) tablet, Take 1 tablet by mouth daily., Disp: , Rfl:    ondansetron  (ZOFRAN ) 4 MG tablet, Take 1 tablet (4 mg total) by mouth every 8 (eight) hours as needed for nausea or vomiting., Disp: 21 tablet, Rfl: 0   pantoprazole  (PROTONIX ) 20 MG tablet, 20mg  BID x 2weeks, then 20mg  daily before breakfast till complete, Disp: 60 tablet, Rfl: 0

## 2024-01-19 ENCOUNTER — Encounter: Payer: Self-pay | Admitting: Pulmonary Disease

## 2024-01-21 ENCOUNTER — Ambulatory Visit: Admitting: Pulmonary Disease

## 2024-01-21 ENCOUNTER — Other Ambulatory Visit: Payer: Self-pay

## 2024-01-21 MED ORDER — BREZTRI AEROSPHERE 160-9-4.8 MCG/ACT IN AERO: 2.0000 | INHALATION_SPRAY | Freq: Two times a day (BID) | RESPIRATORY_TRACT | 3 refills | Status: AC

## 2024-04-17 ENCOUNTER — Ambulatory Visit: Admitting: Pulmonary Disease
